# Patient Record
Sex: Female | Born: 1950 | Race: Black or African American | Hispanic: No | Smoking: Never smoker
Health system: Southern US, Community
[De-identification: ages and names within clinical notes are randomized; demographics above are authoritative.]

## PROBLEM LIST (undated history)

## (undated) DIAGNOSIS — I2699 Other pulmonary embolism without acute cor pulmonale: Secondary | ICD-10-CM

## (undated) DIAGNOSIS — J40 Bronchitis, not specified as acute or chronic: Secondary | ICD-10-CM

## (undated) DIAGNOSIS — E119 Type 2 diabetes mellitus without complications: Secondary | ICD-10-CM

## (undated) DIAGNOSIS — I10 Essential (primary) hypertension: Secondary | ICD-10-CM

## (undated) DIAGNOSIS — E7800 Pure hypercholesterolemia, unspecified: Secondary | ICD-10-CM

## (undated) HISTORY — PX: BREAST SURGERY: SHX581

## (undated) HISTORY — PX: TONSILLECTOMY: SUR1361

## (undated) HISTORY — PX: ABDOMINAL HYSTERECTOMY: SHX81

## (undated) HISTORY — PX: BACK SURGERY: SHX140

---

## 2008-02-16 ENCOUNTER — Emergency Department (HOSPITAL_COMMUNITY): Admission: EM | Admit: 2008-02-16 | Discharge: 2008-02-16 | Payer: Self-pay | Admitting: Emergency Medicine

## 2011-05-29 ENCOUNTER — Emergency Department (HOSPITAL_COMMUNITY): Payer: Self-pay

## 2011-05-29 ENCOUNTER — Encounter: Payer: Self-pay | Admitting: *Deleted

## 2011-05-29 ENCOUNTER — Inpatient Hospital Stay (HOSPITAL_COMMUNITY)
Admission: EM | Admit: 2011-05-29 | Discharge: 2011-05-31 | DRG: 192 | Disposition: A | Discharge status: Home / Self Care | Payer: Self-pay | Attending: Internal Medicine | Admitting: Internal Medicine

## 2011-05-29 DIAGNOSIS — J440 Chronic obstructive pulmonary disease with acute lower respiratory infection: Principal | ICD-10-CM | POA: Diagnosis present

## 2011-05-29 DIAGNOSIS — I10 Essential (primary) hypertension: Secondary | ICD-10-CM | POA: Diagnosis present

## 2011-05-29 DIAGNOSIS — J441 Chronic obstructive pulmonary disease with (acute) exacerbation: Secondary | ICD-10-CM | POA: Diagnosis present

## 2011-05-29 DIAGNOSIS — E785 Hyperlipidemia, unspecified: Secondary | ICD-10-CM | POA: Diagnosis present

## 2011-05-29 DIAGNOSIS — J45902 Unspecified asthma with status asthmaticus: Secondary | ICD-10-CM

## 2011-05-29 DIAGNOSIS — J0110 Acute frontal sinusitis, unspecified: Secondary | ICD-10-CM | POA: Diagnosis present

## 2011-05-29 DIAGNOSIS — J209 Acute bronchitis, unspecified: Principal | ICD-10-CM | POA: Diagnosis present

## 2011-05-29 HISTORY — DX: Pure hypercholesterolemia, unspecified: E78.00

## 2011-05-29 HISTORY — DX: Bronchitis, not specified as acute or chronic: J40

## 2011-05-29 HISTORY — DX: Essential (primary) hypertension: I10

## 2011-05-29 MED ORDER — ALBUTEROL SULFATE (5 MG/ML) 0.5% IN NEBU
2.5000 mg | INHALATION_SOLUTION | Freq: Once | RESPIRATORY_TRACT | Status: AC
  Administered 2011-05-29 (×2): 2.5 mg via RESPIRATORY_TRACT
  Filled 2011-05-29: qty 3

## 2011-05-29 MED ORDER — IPRATROPIUM BROMIDE 0.02 % IN SOLN
0.5000 mg | Freq: Once | RESPIRATORY_TRACT | Status: AC
  Administered 2011-05-29: 0.5 mg via RESPIRATORY_TRACT
  Filled 2011-05-29: qty 2.5

## 2011-05-29 MED ORDER — ALBUTEROL SULFATE (2.5 MG/3ML) 0.083% IN NEBU
INHALATION_SOLUTION | RESPIRATORY_TRACT | Status: AC
  Administered 2011-05-29: 2.5 mg via RESPIRATORY_TRACT
  Filled 2011-05-29: qty 3

## 2011-05-29 MED ORDER — ALBUTEROL SULFATE (5 MG/ML) 0.5% IN NEBU
5.0000 mg | INHALATION_SOLUTION | Freq: Once | RESPIRATORY_TRACT | Status: AC
  Administered 2011-05-30: 5 mg via RESPIRATORY_TRACT
  Filled 2011-05-29: qty 6

## 2011-05-29 MED ORDER — PREDNISONE 20 MG PO TABS
60.0000 mg | ORAL_TABLET | Freq: Once | ORAL | Status: AC
  Administered 2011-05-29: 60 mg via ORAL
  Filled 2011-05-29: qty 3

## 2011-05-29 MED ORDER — IPRATROPIUM BROMIDE 0.02 % IN SOLN
0.5000 mg | Freq: Once | RESPIRATORY_TRACT | Status: AC
  Administered 2011-05-30: 0.5 mg via RESPIRATORY_TRACT
  Filled 2011-05-29: qty 2.5

## 2011-05-29 MED ORDER — ALBUTEROL (5 MG/ML) CONTINUOUS INHALATION SOLN
15.0000 mg | INHALATION_SOLUTION | Freq: Once | RESPIRATORY_TRACT | Status: AC
  Administered 2011-05-29: 15 mg via RESPIRATORY_TRACT
  Filled 2011-05-29: qty 20

## 2011-05-29 MED ORDER — SODIUM CHLORIDE 0.9 % IN NEBU
INHALATION_SOLUTION | RESPIRATORY_TRACT | Status: AC
  Administered 2011-05-29: 6 mL
  Filled 2011-05-29: qty 6

## 2011-05-29 MED ORDER — ALBUTEROL SULFATE (2.5 MG/3ML) 0.083% IN NEBU
INHALATION_SOLUTION | RESPIRATORY_TRACT | Status: AC
  Administered 2011-05-29: 20:00:00
  Filled 2011-05-29: qty 18

## 2011-05-29 NOTE — ED Notes (Signed)
Pt states cough and SOB began ~ 2-3 weeks ago.  Hx of asthma and bronchitis. Pt states productive cough, yellow in color. States she has felt "like wind pipe is closing up" for a couple of days. Audible wheezing noted.

## 2011-05-29 NOTE — ED Provider Notes (Cosign Needed)
History     Chief Complaint  Patient presents with  . Shortness of Breath   HPI Comments: Pt with h/o asthma c/o difficulty breathing worsening x 3 months, worse at night. +cough productive of greenish-yellow sputum x 2 months. No fever. Pt uses home neb and inhalers, provides relief for a few hours. +mild nausea and mild sore throat. Runny nose with severe coughing fits only. Chest tightness when she "can't breathe" but no pain. June 28th tests by asthma/allergy specialist, including sleep apnea, will receive results in 3 days, also was seen when sx began. Pt has been on prednisone previously, last time approx 2 months ago. No home O2. Pt is not a smoker. Pt reports she has had asthma for a long time but that it has been worse as an adult.   Patient is a 60 y.o. female presenting with shortness of breath. The history is provided by the patient.  Shortness of Breath  The current episode started more than 2 weeks ago (3 months ago). The onset was gradual. The problem occurs frequently. The problem has been gradually worsening. The problem is moderate. Relieved by: home neb and inhalers. Exacerbated by: lying flat/at night. Associated symptoms include orthopnea, cough, shortness of breath and wheezing. Pertinent negatives include no chest pain and no fever.    Past Medical History  Diagnosis Date  . Hypertension   . Asthma   . Bronchitis   . High cholesterol     Past Surgical History  Procedure Date  . Abdominal hysterectomy   . Breast surgery   . Back surgery   . Tonsillectomy     History reviewed. No pertinent family history.  History  Substance Use Topics  . Smoking status: Never Smoker   . Smokeless tobacco: Not on file  . Alcohol Use: No    OB History    Grav Para Term Preterm Abortions TAB SAB Ect Mult Living                  Review of Systems  Constitutional: Negative for fever.  Respiratory: Positive for cough, shortness of breath and wheezing.   Cardiovascular:  Positive for orthopnea. Negative for chest pain.  All other systems reviewed and are negative.    Physical Exam  BP 156/94  Pulse 98  Temp(Src) 98.1 F (36.7 C) (Oral)  Resp 26  SpO2 99%  Physical Exam  Constitutional: She is oriented to person, place, and time. She appears well-developed and well-nourished.  Non-toxic appearance. She does not appear ill. No distress.  HENT:  Head: Normocephalic and atraumatic.  Right Ear: External ear normal.  Left Ear: External ear normal.  Nose: Nose normal. No mucosal edema or rhinorrhea.  Mouth/Throat: Oropharynx is clear and moist and mucous membranes are normal. No dental abscesses or uvula swelling.  Eyes: Conjunctivae and EOM are normal. Pupils are equal, round, and reactive to light.  Neck: Normal range of motion and full passive range of motion without pain. Neck supple.  Cardiovascular: Normal rate, regular rhythm and normal heart sounds.  Exam reveals no gallop and no friction rub.   No murmur heard. Pulmonary/Chest: Effort normal. No accessory muscle usage. Not tachypneic. No respiratory distress. She has decreased breath sounds. She has wheezes. She has rhonchi. She has no rales. She exhibits no tenderness and no crepitus.  Abdominal: Soft. Normal appearance and bowel sounds are normal. She exhibits no distension. There is no tenderness. There is no rebound and no guarding.  Musculoskeletal: Normal range of  motion. She exhibits no edema and no tenderness.       Moves all extremities well.   Neurological: She is alert and oriented to person, place, and time. She has normal strength. No cranial nerve deficit.  Skin: Skin is warm, dry and intact. No rash noted. No erythema. No pallor.  Psychiatric: She has a normal mood and affect. Her speech is normal and behavior is normal. Her mood appears not anxious.    ED Course  Procedures Written by Enos Fling acting as scribe for Dr. Lynelle Doctor.  I personally performed the services described  in this documentation, which was scribed in my presence. The recorded information has been reviewed and considered. Devoria Albe, MD, FACEP  MDM   PT given continous nebulzer of albuterol/atrovent.   2030 -- Pt reeval: Pt still on neb, wheezing improved but still present, will continue neb and give dose prednisone and recheck again.  2200 -- Pt reeval: Pt breathing improved, mild wheezing, will attempt walking with pulse ox.  When patient ambulated she became very SOB and tachypneic and her wheezing got worse. Pt given another nebulizer.   Results for orders placed during the hospital encounter of 05/29/11  CBC      Component Value Range   WBC 7.3  4.0 - 10.5 (K/uL)   RBC 4.30  3.87 - 5.11 (MIL/uL)   Hemoglobin 13.5  12.0 - 15.0 (g/dL)   HCT 16.1  09.6 - 04.5 (%)   MCV 96.7  78.0 - 100.0 (fL)   MCH 31.4  26.0 - 34.0 (pg)   MCHC 32.5  30.0 - 36.0 (g/dL)   RDW 40.9  81.1 - 91.4 (%)   Platelets 228  150 - 400 (K/uL)  BASIC METABOLIC PANEL      Component Value Range   Sodium 139  135 - 145 (mEq/L)   Potassium 3.6  3.5 - 5.1 (mEq/L)   Chloride 102  96 - 112 (mEq/L)   CO2 27  19 - 32 (mEq/L)   Glucose, Bld 111 (*) 70 - 99 (mg/dL)   BUN 8  6 - 23 (mg/dL)   Creatinine, Ser 7.82  0.50 - 1.10 (mg/dL)   Calcium 9.8  8.4 - 95.6 (mg/dL)   GFR calc non Af Amer >60  >60 (mL/min)   GFR calc Af Amer >60  >60 (mL/min)  Dg Chest 2 View  05/30/2011  *RADIOLOGY REPORT*  Clinical Data: 60 year old female with shortness of breath, cough, wheezing.  CHEST - 2 VIEW  Comparison: None.  Findings: Lung volumes within normal limits.  Cardiac size and mediastinal contours are within normal limits.  Visualized tracheal air column is within normal limits.  Cervical ACDF hardware.  No pneumothorax, pulmonary edema, pleural effusion or confluent pulmonary opacity. No acute osseous abnormality identified.  IMPRESSION: No acute cardiopulmonary abnormality.  Original Report Authenticated By: Harley Hallmark, M.D.    Ward Givens, MD 05/30/11 2130  Ward Givens, MD 06/01/11 1048

## 2011-05-29 NOTE — ED Notes (Signed)
Pt reports feeling better.  Paged respiratory

## 2011-05-29 NOTE — ED Notes (Signed)
Ambulated pt around nurses station.  Pt did not do well.  Pt had to stop in the middle and sit down.  I listened to pt's lungs and there was inspiratory and expiratory wheezing.  Pt also became sob.  Pt back in room and nad noted

## 2011-05-30 ENCOUNTER — Encounter (HOSPITAL_COMMUNITY): Payer: Self-pay | Admitting: *Deleted

## 2011-05-30 DIAGNOSIS — J209 Acute bronchitis, unspecified: Secondary | ICD-10-CM | POA: Diagnosis present

## 2011-05-30 DIAGNOSIS — J441 Chronic obstructive pulmonary disease with (acute) exacerbation: Secondary | ICD-10-CM | POA: Diagnosis present

## 2011-05-30 LAB — CBC
HCT: 41.6 % (ref 36.0–46.0)
HCT: 40.4 % (ref 36.0–46.0)
Hemoglobin: 13.5 g/dL (ref 12.0–15.0)
Hemoglobin: 13.3 g/dL (ref 12.0–15.0)
MCH: 31.4 pg (ref 26.0–34.0)
MCH: 31.8 pg (ref 26.0–34.0)
MCHC: 32.5 g/dL (ref 30.0–36.0)
MCHC: 32.9 g/dL (ref 30.0–36.0)
MCV: 96.7 fL (ref 78.0–100.0)
MCV: 96.7 fL (ref 78.0–100.0)
Platelets: 228 10*3/uL (ref 150–400)
Platelets: 221 10*3/uL (ref 150–400)
RBC: 4.3 MIL/uL (ref 3.87–5.11)
RBC: 4.18 MIL/uL (ref 3.87–5.11)
RDW: 12.7 % (ref 11.5–15.5)
RDW: 12.8 % (ref 11.5–15.5)
WBC: 7.3 10*3/uL (ref 4.0–10.5)
WBC: 4.2 10*3/uL (ref 4.0–10.5)

## 2011-05-30 LAB — BASIC METABOLIC PANEL
BUN: 8 mg/dL (ref 6–23)
CO2: 27 mEq/L (ref 19–32)
Calcium: 9.8 mg/dL (ref 8.4–10.5)
Chloride: 102 mEq/L (ref 96–112)
Creatinine, Ser: 0.61 mg/dL (ref 0.50–1.10)
GFR calc Af Amer: >60 mL/min (ref >60)
GFR calc non Af Amer: >60 mL/min (ref >60)
Glucose, Bld: 111 mg/dL — ABNORMAL HIGH (ref 70–99)
Potassium: 3.6 mEq/L (ref 3.5–5.1)
Sodium: 139 mEq/L (ref 135–145)

## 2011-05-30 LAB — CARDIAC PANEL(CRET KIN+CKTOT+MB+TROPI)
CK, MB: 2.2 ng/mL (ref 0.3–4.0)
Relative Index: 1.5 (ref 0.0–2.5)
Total CK: 142 U/L (ref 7–177)
Troponin I: <0.3 ng/mL (ref <0.30)

## 2011-05-30 LAB — COMPREHENSIVE METABOLIC PANEL
ALT: 34 U/L (ref 0–35)
AST: 18 U/L (ref 0–37)
Albumin: 3.5 g/dL (ref 3.5–5.2)
Alkaline Phosphatase: 101 U/L (ref 39–117)
BUN: 9 mg/dL (ref 6–23)
CO2: 24 mEq/L (ref 19–32)
Calcium: 10.2 mg/dL (ref 8.4–10.5)
Chloride: 103 mEq/L (ref 96–112)
Creatinine, Ser: 0.67 mg/dL (ref 0.50–1.10)
GFR calc Af Amer: >60 mL/min (ref >60)
GFR calc non Af Amer: >60 mL/min (ref >60)
Glucose, Bld: 177 mg/dL — ABNORMAL HIGH (ref 70–99)
Potassium: 4.2 mEq/L (ref 3.5–5.1)
Sodium: 140 mEq/L (ref 135–145)
Total Bilirubin: 0.1 mg/dL — ABNORMAL LOW (ref 0.3–1.2)
Total Protein: 7.5 g/dL (ref 6.0–8.3)

## 2011-05-30 LAB — DIFFERENTIAL
Basophils Absolute: 0 10*3/uL (ref 0.0–0.1)
Basophils Relative: 1 % (ref 0–1)
Eosinophils Absolute: 0 10*3/uL (ref 0.0–0.7)
Eosinophils Relative: 0 % (ref 0–5)
Lymphocytes Relative: 19 % (ref 12–46)
Lymphs Abs: 0.8 10*3/uL (ref 0.7–4.0)
Monocytes Absolute: 0.1 10*3/uL (ref 0.1–1.0)
Monocytes Relative: 2 % — ABNORMAL LOW (ref 3–12)
Neutro Abs: 3.3 10*3/uL (ref 1.7–7.7)
Neutrophils Relative %: 78 % — ABNORMAL HIGH (ref 43–77)

## 2011-05-30 LAB — MAGNESIUM: Magnesium: 2 mg/dL (ref 1.5–2.5)

## 2011-05-30 MED ORDER — ENOXAPARIN SODIUM 40 MG/0.4ML ~~LOC~~ SOLN
40.0000 mg | SUBCUTANEOUS | Status: DC
  Administered 2011-05-30 – 2011-05-31 (×2): 40 mg via SUBCUTANEOUS
  Filled 2011-05-30 (×2): qty 0.4

## 2011-05-30 MED ORDER — SODIUM CHLORIDE 0.9 % IJ SOLN
INTRAMUSCULAR | Status: AC
  Administered 2011-05-30: 05:00:00
  Filled 2011-05-30: qty 10

## 2011-05-30 MED ORDER — TRAZODONE HCL 50 MG PO TABS
150.0000 mg | ORAL_TABLET | Freq: Every day | ORAL | Status: DC
  Administered 2011-05-30: 150 mg via ORAL
  Filled 2011-05-30: qty 3

## 2011-05-30 MED ORDER — ALBUTEROL SULFATE (2.5 MG/3ML) 0.083% IN NEBU
2.5000 mg | INHALATION_SOLUTION | RESPIRATORY_TRACT | Status: DC
  Administered 2011-05-30 (×2): 2.5 mg via RESPIRATORY_TRACT

## 2011-05-30 MED ORDER — LEVOFLOXACIN IN D5W 500 MG/100ML IV SOLN
INTRAVENOUS | Status: AC
  Filled 2011-05-30: qty 100

## 2011-05-30 MED ORDER — LEVOFLOXACIN 500 MG PO TABS
500.0000 mg | ORAL_TABLET | Freq: Every day | ORAL | Status: DC
  Administered 2011-05-30 – 2011-05-31 (×2): 500 mg via ORAL
  Filled 2011-05-30 (×2): qty 1

## 2011-05-30 MED ORDER — LEVOFLOXACIN IN D5W 500 MG/100ML IV SOLN
500.0000 mg | INTRAVENOUS | Status: DC
  Administered 2011-05-30: 500 mg via INTRAVENOUS
  Filled 2011-05-30 (×2): qty 100

## 2011-05-30 MED ORDER — AMLODIPINE BESYLATE 5 MG PO TABS
10.0000 mg | ORAL_TABLET | Freq: Every day | ORAL | Status: DC
  Administered 2011-05-30 – 2011-05-31 (×2): 10 mg via ORAL
  Filled 2011-05-30 (×2): qty 2

## 2011-05-30 MED ORDER — ALBUTEROL SULFATE (2.5 MG/3ML) 0.083% IN NEBU
INHALATION_SOLUTION | RESPIRATORY_TRACT | Status: AC
  Administered 2011-05-30: 2.5 mg via RESPIRATORY_TRACT
  Filled 2011-05-30: qty 3

## 2011-05-30 MED ORDER — PREDNISONE 20 MG PO TABS
40.0000 mg | ORAL_TABLET | Freq: Two times a day (BID) | ORAL | Status: DC
  Administered 2011-05-30 – 2011-05-31 (×3): 40 mg via ORAL
  Filled 2011-05-30 (×3): qty 2

## 2011-05-30 MED ORDER — CALCIUM CARBONATE-VITAMIN D 500-200 MG-UNIT PO TABS
1.0000 | ORAL_TABLET | Freq: Two times a day (BID) | ORAL | Status: DC
  Administered 2011-05-30 – 2011-05-31 (×3): 1 via ORAL
  Filled 2011-05-30 (×3): qty 1

## 2011-05-30 MED ORDER — MONTELUKAST SODIUM 10 MG PO TABS
10.0000 mg | ORAL_TABLET | Freq: Every day | ORAL | Status: DC
  Administered 2011-05-30: 10 mg via ORAL
  Filled 2011-05-30: qty 1

## 2011-05-30 MED ORDER — ACETAMINOPHEN 325 MG PO TABS
650.0000 mg | ORAL_TABLET | Freq: Four times a day (QID) | ORAL | Status: DC | PRN
  Administered 2011-05-30: 650 mg via ORAL
  Filled 2011-05-30: qty 2

## 2011-05-30 MED ORDER — ALBUTEROL SULFATE (2.5 MG/3ML) 0.083% IN NEBU
2.5000 mg | INHALATION_SOLUTION | Freq: Four times a day (QID) | RESPIRATORY_TRACT | Status: DC
  Administered 2011-05-30: 2.5 mg via RESPIRATORY_TRACT

## 2011-05-30 MED ORDER — CALCIUM CARBONATE-VITAMIN D 250-125 MG-UNIT PO TABS
1.0000 | ORAL_TABLET | Freq: Two times a day (BID) | ORAL | Status: DC
  Filled 2011-05-30 (×3): qty 1

## 2011-05-30 MED ORDER — IPRATROPIUM BROMIDE 0.02 % IN SOLN
0.5000 mg | Freq: Four times a day (QID) | RESPIRATORY_TRACT | Status: DC
  Administered 2011-05-30: 0.5 mg via RESPIRATORY_TRACT
  Filled 2011-05-30: qty 2.5

## 2011-05-30 MED ORDER — IPRATROPIUM BROMIDE 0.02 % IN SOLN
0.5000 mg | RESPIRATORY_TRACT | Status: DC
  Administered 2011-05-30 (×2): 0.5 mg via RESPIRATORY_TRACT
  Filled 2011-05-30 (×2): qty 2.5

## 2011-05-30 MED ORDER — METHYLPREDNISOLONE SODIUM SUCC 40 MG IJ SOLR
40.0000 mg | Freq: Four times a day (QID) | INTRAMUSCULAR | Status: DC
  Administered 2011-05-30 (×2): 40 mg via INTRAVENOUS
  Filled 2011-05-30 (×2): qty 1

## 2011-05-30 MED ORDER — SODIUM CHLORIDE 0.9 % IV SOLN: Freq: Once | INTRAVENOUS | Status: DC

## 2011-05-30 MED ORDER — IBUPROFEN 400 MG PO TABS
400.0000 mg | ORAL_TABLET | Freq: Four times a day (QID) | ORAL | Status: DC | PRN
  Administered 2011-05-30 – 2011-05-31 (×2): 400 mg via ORAL
  Filled 2011-05-30 (×2): qty 1

## 2011-05-30 MED ORDER — PANTOPRAZOLE SODIUM 40 MG PO TBEC
40.0000 mg | DELAYED_RELEASE_TABLET | Freq: Every day | ORAL | Status: DC
  Administered 2011-05-30 – 2011-05-31 (×2): 40 mg via ORAL
  Filled 2011-05-30 (×2): qty 1

## 2011-05-30 NOTE — H&P (Signed)
  [id number] 

## 2011-05-30 NOTE — Progress Notes (Signed)
Chart reviewed. Pt examined.  Feels better, but DOE.  Lungs diminished, but good air movement and no W/R/R. See orders

## 2011-05-30 NOTE — ED Notes (Signed)
Patient is resting comfortably. 

## 2011-05-30 NOTE — ED Notes (Signed)
REPORT GIVEN TO NADINE, RN

## 2011-05-30 NOTE — H&P (Signed)
Tanya Fowler, Tanya Fowler             ACCOUNT NO.:  [id number]  MEDICAL RECORD NO.:  [id number]  LOCATION:  [facility]                         FACILITY:  [facility]  PHYSICIAN:  Talmage Nap, MD  DATE OF BIRTH:  Jun 19, 1951  DATE OF ADMISSION:  05/29/2011 DATE OF DISCHARGE:  LH                             HISTORY & PHYSICAL   PRIMARY CARE PHYSICIAN:  Unknown.  History mainly obtainable from the patient.  CHIEF COMPLAINT:  Cough of unspecified duration.  The patient is a 60 year old African American female with history of COPD, questionable asthma, and hypertension presented to the emergency room with cough, which the patient has been on and off for unspecified duration.  However, this present episode was of 2-3 days duration and cough was said to be productive of whitish and sometimes brownish sputum.  This was associated with nonpleuritic chest pain and shortness of breath.  The patient claims she has subjective feeling of fever and chills.  She denied any rigor.  She claimed she was nauseated, but denied any vomiting.  She also denies any history of diarrhea.  No PND or orthopnea.  The cough was said to have been getting progressively worse hence the patient presented to the emergency room to be evaluated.  PAST MEDICAL HISTORY: 1. Positive for hypertension. 2. Questionable COPD. 3. Asthma. 4. Acute bronchitis. 5. Hyperlipidemia.  PAST SURGICAL HISTORY: 1. Abdominal hysterectomy. 2. Lumpectomy. 3. Back surgery. 4. Tonsillectomy.  PREADMISSION MEDICATIONS: 1. Albuterol nebulizer q.6 p.r.n. 2. Budesonide/formoterol (Symbicort) 1 mL/4.5 two puffs b.i.d. 3. Calcium with vitamin D (Os-Cal with D) 250/25 one tablet twice a     day. 4. Omeprazole (Nexium) 40 mg twice a day. 5. Fluticasone (Veramyst) 27.5 mcg 2 sprays daily. 6. Montelukast (singular) 10 mg p.o. daily. 7. Trazodone (Desyrel ) 150 mg p.o. at bedtime.  ALLERGIES:  PENICILLIN.  SOCIAL HISTORY:  Negative for alcohol  or tobacco use.  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  FAMILY HISTORY:  Positive for obstructive airway disease.  REVIEW OF SYSTEMS:  The patient denies any history of headaches.  No blurred vision.  Complained of chronic cough that got progressively worse.  Cough is said to productive of whitish and with occasional brownish sputum with associated nonpleuritic chest pain and shortness of breath.  She also complained of subjective feeling of fever with chills. Denied any rigor.  No PND, orthopnea.  Denied any vomiting but nauseated.  Denies any abdominal discomfort.  No diarrhea or hematochezia.  No dysuria or hematuria.  She has occasional swelling of the lower extremity.  No intolerance to heat or cold, and no neuropsychiatric disorder.  PHYSICAL EXAMINATION:  GENERAL:  Slightly elderly lady not in any obvious respiratory distress with adequate hydration. PRESENT VITAL SIGNS:  Temperature is 98.1, pulse is 107, respiratory rate is 16, blood pressure is 137/75, saturation 94% on room air. HEENT:  Pupils are reactive to light and extraocular muscles are intact. NECK:  No jugular venous distention.  No carotid bruit.  No lymphadenopathy. CHEST:  Wide-spread expiratory rhonchi. HEART:  Sounds are one and two. ABDOMEN:  Soft, nontender.  Liver, spleen, kidney not palpable.  Bowel sounds are positive.  EXTREMITIES:  No pedal edema. NEUROLOGIC:  Nonfocal. MUSCULOSKELETAL:  Unremarkable. SKIN:  Normal turgor.  LABORATORY DATA:  Chemistry showed sodium of 139, potassium of 3.6, chloride of 102 with a bicarb of 27, BUN is 8, creatinine 0.61, glucose is 111.  Complete blood count with differential showed WBC of 7.3, hemoglobin 13.5, hematocrit 44.6, MCV of 96.7 with a platelet count of to 228.  Imaging studies done on the patient include chest x-ray, which showed hyperinflated lung.  No infiltrates seen.  PROBLEM LIST: 1. Chronic cough. 2. Shortness  of breath. 3. Hypertension. 4. History of hyperlipidemia.  IMPRESSION: 1. Chronic obstructive pulmonary disease with acute bronchitis. 2. Chronic cough most likely secondary to ACE inhibitor. 3. Hypertension. 4. Hyperlipidemia. 5. History of asthma.  Plan is to admit the patient to telemetry.  The patient will be on O2 via nasal cannula 2-3 liters per minute.  She will be on albuterol and Atrovent nebs q.4 hourly, and she also will be given IV Solu-Medrol. Other medication to be given to the patient will include Levaquin 500 mg IV q.24.  Blood pressure will be maintained with Norvasc 10 mg p.o. daily, and lisinopril will be discontinued because of history of chronic cough.  The patient will be continued on montelukast 10 mg p.o. daily and trazodone 150 mg p.o. daily.  GI prophylaxis will be with p.o. Protonix, and DVT prophylaxis with TED stockings and Lovenox 40 mg subcu q.24 h.  Further workup to be done on this patient will include cardiac enzymes q.6 x3 and 2D echo to rule out pulmonary hypertension. CBC, CMP, and magnesium will be repeated in the a.m.  The patient will be followed and evaluated on day-to-day basis.     Talmage Nap, MD     CN/MEDQ  D:  05/30/2011  T:  05/30/2011  Job:  [id number]

## 2011-05-30 NOTE — ED Notes (Signed)
Family at bedside. 

## 2011-05-31 DIAGNOSIS — J0110 Acute frontal sinusitis, unspecified: Secondary | ICD-10-CM | POA: Diagnosis present

## 2011-05-31 DIAGNOSIS — I10 Essential (primary) hypertension: Secondary | ICD-10-CM | POA: Diagnosis present

## 2011-05-31 MED ORDER — LEVOFLOXACIN 500 MG PO TABS: 500.0000 mg | ORAL_TABLET | Freq: Every day | ORAL | Status: AC

## 2011-05-31 MED ORDER — IPRATROPIUM BROMIDE 0.02 % IN SOLN
0.5000 mg | Freq: Four times a day (QID) | RESPIRATORY_TRACT | Status: DC
  Administered 2011-05-31: 0.5 mg via RESPIRATORY_TRACT
  Filled 2011-05-31: qty 2.5

## 2011-05-31 MED ORDER — ALBUTEROL SULFATE (2.5 MG/3ML) 0.083% IN NEBU
INHALATION_SOLUTION | RESPIRATORY_TRACT | Status: AC
  Administered 2011-05-31: 2.5 mg via RESPIRATORY_TRACT
  Filled 2011-05-31: qty 3

## 2011-05-31 MED ORDER — ALBUTEROL SULFATE (2.5 MG/3ML) 0.083% IN NEBU
2.5000 mg | INHALATION_SOLUTION | Freq: Four times a day (QID) | RESPIRATORY_TRACT | Status: DC
  Administered 2011-05-31: 2.5 mg via RESPIRATORY_TRACT

## 2011-05-31 MED ORDER — LISINOPRIL 10 MG PO TABS: 10.0000 mg | ORAL_TABLET | Freq: Every day | ORAL | Status: DC

## 2011-05-31 MED ORDER — PREDNISONE (PAK) 10 MG PO TABS: 10.0000 mg | ORAL_TABLET | Freq: Every day | ORAL | Status: AC

## 2011-05-31 NOTE — Progress Notes (Signed)
IV removed from Lt forearm cath tip intact site benign care notes for new meds given. [place] information given Pt discharged to home with friend

## 2011-05-31 NOTE — Discharge Summary (Signed)
Physician Discharge Summary  Patient ID: MELONEY FELD MRN: [id number] DOB/AGE: 1951/11/09 60 y.o.  Admit date: 05/29/2011 Discharge date: 05/31/2011  Discharge Diagnoses:  Principal Problem:  *COPD exacerbation Active Problems:  Acute bronchitis  Acute frontal sinusitis  HTN (hypertension), benign   Discharged Condition: Stable  Hospital Course:   Patient is a 60 year old black female with history of obstructive lung disease who presents with shortness of breath and cough. She was wheezing in the emergency room. She does not smoke but has secondhand smoke exposure. She has had pulmonary function tests previously but does not know the results. She also had frontal sinus pressure and pain. Check postnasal drip. Initially, her cough was productive of purulent sputum. She had unremarkable vital signs on admission. She had rhonchi according to Dr. Wynelle Bourgeois H&P. She was admitted. She was started on steroids bronchodilators oxygen and antibiotics. By the time of discharge, she was feeling much better. She was able to ambulate without difficulty. She had clear lungs on physical examination. She has normal vital signs. Her other medical problems remained stable during hospitalization. Total time on the day of discharge is greater than 30 minutes.  Consults: none  Significant Diagnostic Studies: Dg Chest 2 View  05/30/2011  *RADIOLOGY REPORT*  Clinical Data: 60 year old female with shortness of breath, cough, wheezing.  CHEST - 2 VIEW  Comparison: None.  Findings: Lung volumes within normal limits.  Cardiac size and mediastinal contours are within normal limits.  Visualized tracheal air column is within normal limits.  Cervical ACDF hardware.  No pneumothorax, pulmonary edema, pleural effusion or confluent pulmonary opacity. No acute osseous abnormality identified.  IMPRESSION: No acute cardiopulmonary abnormality.  Original Report Authenticated By: Harley Hallmark, M.D.   Discharge Exam: Blood  pressure 125/75, pulse 105, temperature 98 F (36.7 C), temperature source Oral, resp. rate 18, height 5\' 2"  (1.575 m), weight 90.855 kg (200 lb 4.8 oz), SpO2 95.00%. BP 125/75  Pulse 105  Temp(Src) 98 F (36.7 C) (Oral)  Resp 18  Ht 5\' 2"  (1.575 m)  Wt 90.855 kg (200 lb 4.8 oz)  BMI 36.64 kg/m2  SpO2 95%  General Appearance:    Alert, cooperative, no distress, appears stated age  Head:    Normocephalic, without obvious abnormality, atraumatic. Frontal sinus tenderness  Lungs:     Clear to auscultation bilaterally, respirations unlabored   Heart:    Regular rate and rhythm, S1 and S2 normal, no murmur, rub   or gallop  Abdomen:     Soft, non-tender, bowel sounds active all four quadrants,    no masses, no organomegaly  Extremities:   Extremities normal, atraumatic, no cyanosis or edema    Disposition: Final discharge disposition not confirmed  Discharge Orders    Future Orders Please Complete By Expires   Diet - low sodium heart healthy      Increase activity slowly      Discharge instructions      Comments:   DRINK PLENTY OF LIQUIDS     Current Discharge Medication List    START taking these medications   Details  levofloxacin (LEVAQUIN) 500 MG tablet Take 1 tablet (500 mg total) by mouth daily. Qty: 3 tablet, Refills: 0    predniSONE (DELTASONE) 10 MG tablet pack Take 1 tablet (10 mg total) by mouth daily. 4 tablets daily then decrease by 1 tablet every 2 days until off Qty: 20 tablet, Refills: 0      CONTINUE these medications which have NOT CHANGED  Details  albuterol (PROVENTIL HFA;VENTOLIN HFA) 108 (90 BASE) MCG/ACT inhaler Inhale 2 puffs into the lungs every 6 (six) hours as needed. Shortness of breath     albuterol (PROVENTIL) (2.5 MG/3ML) 0.083% nebulizer solution Take 2.5 mg by nebulization 3 (three) times daily as needed. Shortness of breath     budesonide-formoterol (SYMBICORT) 160-4.5 MCG/ACT inhaler Inhale 2 puffs into the lungs 2 (two) times daily.        calcium-vitamin D (OSCAL WITH D) 250-125 MG-UNIT per tablet Take 1 tablet by mouth 2 (two) times daily.      calcium-vitamin D 250-100 MG-UNIT per tablet Take 1 tablet by mouth daily.      desloratadine (CLARINEX) 5 MG tablet Take 5 mg by mouth every morning.      esomeprazole (NEXIUM) 40 MG packet Take 40 mg by mouth 2 (two) times daily before a meal.      fluticasone (VERAMYST) 27.5 MCG/SPRAY nasal spray Place 1 spray into the nose 2 (two) times daily.      ibuprofen (ADVIL,MOTRIN) 200 MG tablet Take 200 mg by mouth every 6 (six) hours as needed. For headache     ipratropium (ATROVENT) 0.02 % nebulizer solution Take 500 mcg by nebulization 3 (three) times daily as needed. Shortness of breath     lisinopril (PRINIVIL,ZESTRIL) 2.5 MG tablet Take 2.5 mg by mouth daily.      montelukast (SINGULAIR) 10 MG tablet Take 10 mg by mouth at bedtime.      omega-3 acid ethyl esters (LOVAZA) 1 G capsule Take 2 g by mouth 2 (two) times daily.      rosuvastatin (CRESTOR) 5 MG tablet Take 5 mg by mouth once a week.      traZODone (DESYREL) 150 MG tablet Take 150 mg by mouth at bedtime as needed. For sleep        Follow-up Information    Follow up with PRIMARY CARE. (As needed)          Signed: Zaela Graley L 05/31/2011, 11:00 AM

## 2011-07-13 NOTE — Progress Notes (Signed)
Encounter addended by: Clarene Critchley on: 07/13/2011  6:26 AM<BR>     Documentation filed: Flowsheet VN

## 2011-08-08 LAB — URINALYSIS, ROUTINE W REFLEX MICROSCOPIC
Bilirubin Urine: NEGATIVE
Glucose, UA: NEGATIVE
Hgb urine dipstick: NEGATIVE
Ketones, ur: NEGATIVE
Nitrite: NEGATIVE
Protein, ur: NEGATIVE
Specific Gravity, Urine: 1.015
Urobilinogen, UA: 0.2
pH: 7

## 2011-08-08 LAB — URINE MICROSCOPIC-ADD ON

## 2015-08-17 ENCOUNTER — Emergency Department (HOSPITAL_COMMUNITY)
Admission: EM | Admit: 2015-08-17 | Discharge: 2015-08-17 | Disposition: A | Discharge status: Home / Self Care | Payer: Medicare Other | Attending: Emergency Medicine | Admitting: Emergency Medicine

## 2015-08-17 ENCOUNTER — Emergency Department (HOSPITAL_COMMUNITY): Payer: Medicare Other

## 2015-08-17 ENCOUNTER — Encounter (HOSPITAL_COMMUNITY): Payer: Self-pay | Admitting: Emergency Medicine

## 2015-08-17 DIAGNOSIS — Z880 Allergy status to penicillin: Secondary | ICD-10-CM | POA: Diagnosis not present

## 2015-08-17 DIAGNOSIS — Z7901 Long term (current) use of anticoagulants: Secondary | ICD-10-CM | POA: Insufficient documentation

## 2015-08-17 DIAGNOSIS — E7800 Pure hypercholesterolemia, unspecified: Secondary | ICD-10-CM | POA: Diagnosis not present

## 2015-08-17 DIAGNOSIS — Z7951 Long term (current) use of inhaled steroids: Secondary | ICD-10-CM | POA: Insufficient documentation

## 2015-08-17 DIAGNOSIS — R0789 Other chest pain: Secondary | ICD-10-CM | POA: Insufficient documentation

## 2015-08-17 DIAGNOSIS — M79605 Pain in left leg: Secondary | ICD-10-CM | POA: Insufficient documentation

## 2015-08-17 DIAGNOSIS — J45901 Unspecified asthma with (acute) exacerbation: Secondary | ICD-10-CM | POA: Insufficient documentation

## 2015-08-17 DIAGNOSIS — Z79899 Other long term (current) drug therapy: Secondary | ICD-10-CM | POA: Diagnosis not present

## 2015-08-17 DIAGNOSIS — R0781 Pleurodynia: Secondary | ICD-10-CM

## 2015-08-17 DIAGNOSIS — R079 Chest pain, unspecified: Secondary | ICD-10-CM

## 2015-08-17 DIAGNOSIS — I10 Essential (primary) hypertension: Secondary | ICD-10-CM | POA: Insufficient documentation

## 2015-08-17 DIAGNOSIS — M79606 Pain in leg, unspecified: Secondary | ICD-10-CM

## 2015-08-17 DIAGNOSIS — Z86711 Personal history of pulmonary embolism: Secondary | ICD-10-CM | POA: Insufficient documentation

## 2015-08-17 DIAGNOSIS — R0602 Shortness of breath: Secondary | ICD-10-CM

## 2015-08-17 HISTORY — DX: Other pulmonary embolism without acute cor pulmonale: I26.99

## 2015-08-17 HISTORY — DX: Type 2 diabetes mellitus without complications: E11.9

## 2015-08-17 LAB — COMPREHENSIVE METABOLIC PANEL
ALT: 32 U/L (ref 14–54)
AST: 24 U/L (ref 15–41)
Albumin: 3.9 g/dL (ref 3.5–5.0)
Alkaline Phosphatase: 73 U/L (ref 38–126)
Anion gap: 9 (ref 5–15)
BUN: 36 mg/dL — ABNORMAL HIGH (ref 6–20)
CO2: 24 mmol/L (ref 22–32)
Calcium: 9 mg/dL (ref 8.9–10.3)
Chloride: 103 mmol/L (ref 101–111)
Creatinine, Ser: 1.47 mg/dL — ABNORMAL HIGH (ref 0.44–1.00)
GFR calc Af Amer: 42 mL/min — ABNORMAL LOW (ref >60)
GFR calc non Af Amer: 36 mL/min — ABNORMAL LOW (ref >60)
Glucose, Bld: 106 mg/dL — ABNORMAL HIGH (ref 65–99)
Potassium: 4.5 mmol/L (ref 3.5–5.1)
Sodium: 136 mmol/L (ref 135–145)
Total Bilirubin: 0.5 mg/dL (ref 0.3–1.2)
Total Protein: 7.6 g/dL (ref 6.5–8.1)

## 2015-08-17 LAB — CBC WITH DIFFERENTIAL/PLATELET
Basophils Absolute: 0 10*3/uL (ref 0.0–0.1)
Basophils Relative: 1 %
Eosinophils Absolute: 0.5 10*3/uL (ref 0.0–0.7)
Eosinophils Relative: 8 %
HCT: 37 % (ref 36.0–46.0)
Hemoglobin: 12.4 g/dL (ref 12.0–15.0)
Lymphocytes Relative: 31 %
Lymphs Abs: 2.2 10*3/uL (ref 0.7–4.0)
MCH: 32.5 pg (ref 26.0–34.0)
MCHC: 33.5 g/dL (ref 30.0–36.0)
MCV: 96.9 fL (ref 78.0–100.0)
Monocytes Absolute: 0.7 10*3/uL (ref 0.1–1.0)
Monocytes Relative: 10 %
Neutro Abs: 3.6 10*3/uL (ref 1.7–7.7)
Neutrophils Relative %: 50 %
Platelets: 271 10*3/uL (ref 150–400)
RBC: 3.82 MIL/uL — ABNORMAL LOW (ref 3.87–5.11)
RDW: 13.5 % (ref 11.5–15.5)
WBC: 7.1 10*3/uL (ref 4.0–10.5)

## 2015-08-17 LAB — TROPONIN I: Troponin I: <0.03 ng/mL (ref <0.031)

## 2015-08-17 MED ORDER — HYDROCODONE-ACETAMINOPHEN 5-325 MG PO TABS
2.0000 | ORAL_TABLET | Freq: Once | ORAL | Status: AC
  Administered 2015-08-17: 2 via ORAL
  Filled 2015-08-17: qty 2

## 2015-08-17 MED ORDER — FENTANYL CITRATE (PF) 100 MCG/2ML IJ SOLN
50.0000 ug | Freq: Once | INTRAMUSCULAR | Status: AC
  Administered 2015-08-17: 50 ug via INTRAVENOUS
  Filled 2015-08-17: qty 2

## 2015-08-17 MED ORDER — HYDROCODONE-ACETAMINOPHEN 5-325 MG PO TABS: 2.0000 | ORAL_TABLET | ORAL | Status: DC | PRN

## 2015-08-17 MED ORDER — AZITHROMYCIN 250 MG PO TABS
ORAL_TABLET | ORAL | Status: DC
Start: 2015-08-17 — End: 2020-12-18

## 2015-08-17 MED ORDER — IOHEXOL 350 MG/ML SOLN
80.0000 mL | Freq: Once | INTRAVENOUS | Status: AC | PRN
  Administered 2015-08-17: 80 mL via INTRAVENOUS

## 2015-08-17 NOTE — Discharge Instructions (Signed)
Please see your doctor later this week for reassessment. Continue to take your blood thinners until your physician directs you to stop. You'll need a repeat CT scan of your lungs in 4-6 weeks, your primary doctor can order.  If you were given medicines take as directed.  If you are on coumadin or contraceptives realize their levels and effectiveness is altered by many different medicines.  If you have any reaction (rash, tongues swelling, other) to the medicines stop taking and see a physician.    If your blood pressure was elevated in the ER make sure you follow up for management with a primary doctor or return for chest pain, shortness of breath or stroke symptoms.  Please follow up as directed and return to the ER or see a physician for new or worsening symptoms.  Thank you. Filed Vitals:   08/17/15 0619 08/17/15 0630 08/17/15 0700  BP: 106/69 91/59 104/59  Pulse: 87 83 78  Temp: 97.9 F (36.6 C)    TempSrc: Oral    Resp: Height:  (1.575 m)    Weight: 225 lb (102.059 kg)    SpO2: 99% 100% 98%

## 2015-08-17 NOTE — ED Provider Notes (Addendum)
Patient's care signed out to me to follow-up CT pulmonary embolism study for final disposition. Instructions if negative for outpatient follow-up and if positive for blood clot to discuss with hematology for blood thinners. CT scan results reviewed no blood clot however patient has small consolidation area that needs repeat CT scan in 4-6 weeks to assess for possible cancer. Discharge discussed and repeat CT scan discuss.\ Ultrasound reviewed no blood clots. Patient's pain was worsening in the right lung area prior to discharge, repeat pain meds ordered. Discussed trial of antibody for possible pneumonia however she understands this may be tumor/malignancy and will follow-up closely. Patient has had a cough recently. Labs Reviewed  CBC WITH DIFFERENTIAL/PLATELET - Abnormal; Notable for the following:    RBC 3.82 (*)    All other components within normal limits  COMPREHENSIVE METABOLIC PANEL - Abnormal; Notable for the following:    Glucose, Bld 106 (*)    BUN 36 (*)    Creatinine, Ser 1.47 (*)    GFR calc non Af Amer 36 (*)    GFR calc Af Amer 42 (*)    All other components within normal limits  TROPONIN I     Dg Chest 2 View  08/17/2015   CLINICAL DATA:  Chest pain and shortness of breath beginning this morning.  EXAM: CHEST  2 VIEW  COMPARISON:  05/29/2011  FINDINGS: Prior right thoracotomy noted with mild scarring seen in the lateral right lung base. No evidence of pulmonary infiltrate or edema. No evidence of pleural effusion. Heart size and mediastinal contours are within normal limits. Cervical spine fusion hardware noted.  IMPRESSION: Postsurgical changes in right hemithorax. No active cardiopulmonary disease.   Electronically Signed   By: Myles Rosenthal M.D.   On: 08/17/2015 07:11   Ct Angio Chest Pe W/cm &/or Wo Cm  08/17/2015   CLINICAL DATA:  Left-sided and central chest pain. Reported history of prior pulmonary embolus.  EXAM: CT ANGIOGRAPHY CHEST WITH CONTRAST  TECHNIQUE:  Multidetector CT imaging of the chest was performed using the standard protocol during bolus administration of intravenous contrast. Multiplanar CT image reconstructions and MIPs were obtained to evaluate the vascular anatomy.  CONTRAST:  80mL OMNIPAQUE IOHEXOL 350 MG/ML SOLN  COMPARISON:  Chest radiograph August 17, 2015  FINDINGS: There is no demonstrable pulmonary embolus. There is no thoracic aortic aneurysm or dissection. The visualize great vessels appear unremarkable.  There is scarring with atelectatic change in the right base region. There is mild posterior pleural thickening bilaterally with fatty change, more on the right than on the left. There is an area of opacity along the pleura of the periphery of the posterior aspect of the right middle lobe region which shows soft tissue attenuation measuring 2.5 x 0.8 cm, best seen on axial slice 42 series 8. There is mild atelectasis in the left base.  Visualized thyroid appears normal.  There is an enlarged lymph node in the right hilum measuring 2.0 x 1.5 cm. There is a mildly prominent lymph node in the anterior mediastinum measuring 1.5 x 1.0 cm seen on axial slice 24 series 7. There are multiple smaller lymph nodes in the mediastinum which do not meet size criteria for pathologic significance. The pericardium is not thickened.  Visualized upper abdominal structures appear normal.  There are no blastic or lytic bone lesions. There is postoperative change in the visualize lower cervical spine.  Review of the MIP images confirms the above findings.  IMPRESSION: No demonstrable pulmonary embolus.  Area  of apparent consolidation along the posterior aspect of the right middle lobe laterally. This area warrants a followup study in 4-6 weeks to assess for clearing. A mass abutting the pleura in this area is possible. There is atelectatic change bilaterally as well.  Focal enlarged lymph nodes in the right hilum and anterior mediastinum of uncertain etiology.  Particular attention to these lymph nodes on subsequent evaluations warranted.   Electronically Signed   By: Bretta Bang III M.D.   On: 08/17/2015 08:56   US Venous Img Lower Bilateral  08/17/2015   CLINICAL DATA:  BILATERAL lower extremity pain. Prior history of DVT. History of anticoagulation therapy.  EXAM: BILATERAL LOWER EXTREMITY VENOUS DOPPLER ULTRASOUND  TECHNIQUE: Gray-scale sonography with graded compression, as well as color Doppler and duplex ultrasound were performed to evaluate the lower extremity deep venous systems from the level of the common femoral vein and including the common femoral, femoral, profunda femoral, popliteal and calf veins including the posterior tibial, peroneal and gastrocnemius veins when visible. The superficial great saphenous vein was also interrogated. Spectral Doppler was utilized to evaluate flow at rest and with distal augmentation maneuvers in the common femoral, femoral and popliteal veins.  COMPARISON:  None.  FINDINGS: RIGHT LOWER EXTREMITY  Common Femoral Vein: No evidence of thrombus. Normal compressibility, respiratory phasicity and response to augmentation.  Saphenofemoral Junction: No evidence of thrombus. Normal compressibility and flow on color Doppler imaging.  Profunda Femoral Vein: No evidence of thrombus. Normal compressibility and flow on color Doppler imaging.  Femoral Vein: No evidence of thrombus. Normal compressibility, respiratory phasicity and response to augmentation.  Popliteal Vein: No evidence of thrombus. Normal compressibility, respiratory phasicity and response to augmentation.  Calf Veins: No evidence of thrombus. Normal compressibility and flow on color Doppler imaging.  Superficial Great Saphenous Vein: No evidence of thrombus. Normal compressibility and flow on color Doppler imaging.  Venous Reflux:  None.  Other Findings:  None.  LEFT LOWER EXTREMITY  Common Femoral Vein: No evidence of thrombus. Normal compressibility,  respiratory phasicity and response to augmentation.  Saphenofemoral Junction: No evidence of thrombus. Normal compressibility and flow on color Doppler imaging.  Profunda Femoral Vein: No evidence of thrombus. Normal compressibility and flow on color Doppler imaging.  Femoral Vein: No evidence of thrombus. Normal compressibility, respiratory phasicity and response to augmentation.  Popliteal Vein: No evidence of thrombus. Normal compressibility, respiratory phasicity and response to augmentation.  Calf Veins: No evidence of thrombus. Normal compressibility and flow on color Doppler imaging.  Superficial Great Saphenous Vein: No evidence of thrombus. Normal compressibility and flow on color Doppler imaging.  Venous Reflux:  None.  Other Findings:  None.  IMPRESSION: No evidence of RIGHT or LEFT-sided deep venous thrombosis.   Electronically Signed   By: Elsie Stain M.D.   On: 08/17/2015 08:27     Blane Ohara, MD 08/17/15 1610  Blane Ohara, MD 08/17/15 [phone]

## 2015-08-17 NOTE — ED Notes (Signed)
Pt seen in [place] 9/28 and dx w/ PE. Pt says been hurts since then, worse tonight.

## 2015-08-17 NOTE — ED Notes (Signed)
Pt c/o central chest pain that radiates down left arm. Pt was seen at [place] for the same.

## 2015-08-17 NOTE — ED Provider Notes (Signed)
CSN: [id number]     Arrival date & time 08/17/15  0605 History   First MD Initiated Contact with Patient 08/17/15 [phone]    Chief Complaint  Patient presents with  . Chest Pain     (Consider location/radiation/quality/duration/timing/severity/associated sxs/prior Treatment) HPI patient reports she was diagnosed with PE/DVT around 2012 and was on ?Arixtra until July when she was diagnosed with a pulmonary embolus at [facility] ED. At that time her medication was switched to Eliquis which she has been taking since July. She states she was seen again at [facility] ED on September 28 and was diagnosed with a PE. Her medications were not changed except she was started on hydrocodone for pain. She states she basically is still having left-sided chest pain that is constant but pleuritic. She states at times it's achy, tingling, and dull. She's been having some intermittent shortness of breath and wheezing that is helped when she uses her nebulizer. She states she has had a cough with green sputum however it is also better with her using the nebulizer. She states today she had chills but she's unsure of fever. She complains of pain in her knees, thighs, and ankles that of been coming and going since July. She states she has not been able to sleep all night due to the pain. She states she was seen by her cardiologist while she was in the ED and she has an appointment to see him in a couple weeks and they're going to discuss some type of surgery to her legs.  PCP Dr Rivka Spring in [facility] Dr Earna Coder in [place]  Past Medical History  Diagnosis Date  . Hypertension   . Asthma   . Bronchitis   . High cholesterol   . PE (pulmonary embolism)    Past Surgical History  Procedure Laterality Date  . Abdominal hysterectomy    . Breast surgery    . Back surgery    . Tonsillectomy     History reviewed. No pertinent family history. Social History  Substance Use Topics  . Smoking status: Never  Smoker   . Smokeless tobacco: None  . Alcohol Use: No   retired  OB History    No data available     Review of Systems  All other systems reviewed and are negative.     Allergies  Penicillins  Home Medications   Prior to Admission medications   Medication Sig Start Date End Date Taking? Authorizing Provider  albuterol (PROVENTIL HFA;VENTOLIN HFA) 108 (90 BASE) MCG/ACT inhaler Inhale 2 puffs into the lungs every 6 (six) hours as needed. Shortness of breath    Yes Historical Provider, MD  albuterol (PROVENTIL) (2.5 MG/3ML) 0.083% nebulizer solution Take 2.5 mg by nebulization 3 (three) times daily as needed. Shortness of breath    Yes Historical Provider, MD  amLODipine-olmesartan (AZOR) 5-40 MG tablet Take 1 tablet by mouth daily.   Yes Historical Provider, MD  apixaban (ELIQUIS) 5 MG TABS tablet Take 5 mg by mouth 2 (two) times daily.   Yes Historical Provider, MD  calcium-vitamin D (OSCAL WITH D) 250-125 MG-UNIT per tablet Take 1 tablet by mouth 2 (two) times daily.     Yes Historical Provider, MD  calcium-vitamin D 250-100 MG-UNIT per tablet Take 1 tablet by mouth daily.     Yes Historical Provider, MD  cetirizine (ZYRTEC) 10 MG tablet Take 10 mg by mouth daily.   Yes Historical Provider, MD  esomeprazole (NEXIUM) 40 MG packet Take 40 mg by  mouth 2 (two) times daily before a meal.     Yes Historical Provider, MD  fluticasone (VERAMYST) 27.5 MCG/SPRAY nasal spray Place 1 spray into the nose 2 (two) times daily.     Yes Historical Provider, MD  Fluticasone-Salmeterol (ADVAIR) 500-50 MCG/DOSE AEPB Inhale 1 puff into the lungs 2 (two) times daily.   Yes Historical Provider, MD  furosemide (LASIX) 20 MG tablet Take 20 mg by mouth.   Yes Historical Provider, MD  ibuprofen (ADVIL,MOTRIN) 200 MG tablet Take 200 mg by mouth every 6 (six) hours as needed. For headache    Yes Historical Provider, MD  ipratropium (ATROVENT) 0.02 % nebulizer solution Take 500 mcg by nebulization 3 (three)  times daily as needed. Shortness of breath    Yes Historical Provider, MD  metFORMIN (GLUCOPHAGE) 500 MG tablet Take by mouth 2 (two) times daily with a meal.   Yes Historical Provider, MD  montelukast (SINGULAIR) 10 MG tablet Take 10 mg by mouth at bedtime.     Yes Historical Provider, MD  omega-3 acid ethyl esters (LOVAZA) 1 G capsule Take 2 g by mouth 2 (two) times daily.     Yes Historical Provider, MD  budesonide-formoterol (SYMBICORT) 160-4.5 MCG/ACT inhaler Inhale 2 puffs into the lungs 2 (two) times daily.      Historical Provider, MD  desloratadine (CLARINEX) 5 MG tablet Take 5 mg by mouth every morning.      Historical Provider, MD  rosuvastatin (CRESTOR) 5 MG tablet Take 5 mg by mouth once a week.      Historical Provider, MD  traZODone (DESYREL) 150 MG tablet Take 150 mg by mouth at bedtime as needed. For sleep     Historical Provider, MD   BP 106/69 mmHg  Pulse 87  Temp(Src) 97.9 F (36.6 C) (Oral)  Resp 18  Ht 5\' 2"  (1.575 m)  Wt 225 lb (102.059 kg)  BMI 41.14 kg/m2  SpO2 99%  Vital signs normal (pulse ox was 100% on room air during my exam)  Physical Exam  Constitutional: She is oriented to person, place, and time. She appears well-developed and well-nourished.  Non-toxic appearance. She does not appear ill. No distress.  HENT:  Head: Normocephalic and atraumatic.  Right Ear: External ear normal.  Left Ear: External ear normal.  Nose: Nose normal. No mucosal edema or rhinorrhea.  Mouth/Throat: Oropharynx is clear and moist and mucous membranes are normal. No dental abscesses or uvula swelling.  Eyes: Conjunctivae and EOM are normal. Pupils are equal, round, and reactive to light.  Neck: Normal range of motion and full passive range of motion without pain. Neck supple.  Cardiovascular: Normal rate, regular rhythm and normal heart sounds.  Exam reveals no gallop and no friction rub.   No murmur heard. Pulmonary/Chest: Effort normal. No respiratory distress. She has  decreased breath sounds. She has no wheezes. She has no rhonchi. She has no rales. She exhibits no tenderness and no crepitus.  When patient breathes deep she acts as if it's painful  Abdominal: Soft. Normal appearance and bowel sounds are normal. She exhibits no distension. There is no tenderness. There is no rebound and no guarding.  Musculoskeletal: Normal range of motion. She exhibits no edema or tenderness.  Moves all extremities well. Patient's left leg appears to be slightly larger than her right. She states that is chronic. She has tenderness to palpation in her distal medial left thigh. She has right calf tenderness without cords felt.  Neurological: She is alert and  oriented to person, place, and time. She has normal strength. No cranial nerve deficit.  Skin: Skin is warm, dry and intact. No rash noted. No erythema. No pallor.  Psychiatric: She has a normal mood and affect. Her speech is normal and behavior is normal. Her mood appears not anxious.  Nursing note and vitals reviewed.   ED Course  Procedures (including critical care time)  Medications  fentaNYL (SUBLIMAZE) injection 50 mcg (50 mcg Intravenous Given 08/17/15 0711)   Patient was given IV fentanyl for her pain. She had a CT and you have done of her chest for her persistent and worsening left-sided chest pain and shortness of breath. She also had Doppler ultrasounds ordered of her lower extremities which have continued to hurt since July. Of concern is that she was already on Eliquis when she was diagnosed with a PE last week. Her medications were not changed. Although she has paperwork from [facility] ED the discharge diagnosis did say pulmonary embolus but the CT report is not on the discharge papers. If she indeed does have new pulmonary emboli patient should be discussed with hematology to discuss her anticoagulation.  07:40 pt left at change of shift with Dr Jodi Mourning to get results of her CT angio chest and her doppler US of  bilateral legs.   Labs Review Results for orders placed or performed during the hospital encounter of 08/17/15  CBC with Differential  Result Value Ref Range   WBC 7.1 4.0 - 10.5 K/uL   RBC 3.82 (L) 3.87 - 5.11 MIL/uL   Hemoglobin 12.4 12.0 - 15.0 g/dL   HCT 62.9 52.8 - 41.3 %   MCV 96.9 78.0 - 100.0 fL   MCH 32.5 26.0 - 34.0 pg   MCHC 33.5 30.0 - 36.0 g/dL   RDW 24.4 01.0 - 27.2 %   Platelets 271 150 - 400 K/uL   Neutrophils Relative % 50 %   Neutro Abs 3.6 1.7 - 7.7 K/uL   Lymphocytes Relative 31 %   Lymphs Abs 2.2 0.7 - 4.0 K/uL   Monocytes Relative 10 %   Monocytes Absolute 0.7 0.1 - 1.0 K/uL   Eosinophils Relative 8 %   Eosinophils Absolute 0.5 0.0 - 0.7 K/uL   Basophils Relative 1 %   Basophils Absolute 0.0 0.0 - 0.1 K/uL  Comprehensive metabolic panel  Result Value Ref Range   Sodium 136 135 - 145 mmol/L   Potassium 4.5 3.5 - 5.1 mmol/L   Chloride 103 101 - 111 mmol/L   CO2 24 22 - 32 mmol/L   Glucose, Bld 106 (H) 65 - 99 mg/dL   BUN 36 (H) 6 - 20 mg/dL   Creatinine, Ser 5.36 (H) 0.44 - 1.00 mg/dL   Calcium 9.0 8.9 - 64.4 mg/dL   Total Protein 7.6 6.5 - 8.1 g/dL   Albumin 3.9 3.5 - 5.0 g/dL   AST 24 15 - 41 U/L   ALT 32 14 - 54 U/L   Alkaline Phosphatase 73 38 - 126 U/L   Total Bilirubin 0.5 0.3 - 1.2 mg/dL   GFR calc non Af Amer 36 (L) >60 mL/min   GFR calc Af Amer 42 (L) >60 mL/min   Anion gap 9 5 - 15  Troponin I  Result Value Ref Range   Troponin I <0.03 <0.031 ng/mL   Laboratory interpretation all normal except renal insufficiency     Imaging Review Dg Chest 2 View  08/17/2015   CLINICAL DATA:  Chest pain and shortness  of breath beginning this morning.  EXAM: CHEST  2 VIEW  COMPARISON:  05/29/2011  FINDINGS: Prior right thoracotomy noted with mild scarring seen in the lateral right lung base. No evidence of pulmonary infiltrate or edema. No evidence of pleural effusion. Heart size and mediastinal contours are within normal limits. Cervical spine  fusion hardware noted.  IMPRESSION: Postsurgical changes in right hemithorax. No active cardiopulmonary disease.   Electronically Signed   By: Myles Rosenthal M.D.   On: 08/17/2015 07:11   I have personally reviewed and evaluated these images and lab results as part of my medical decision-making.   EKG Interpretation   Date/Time:  Tuesday August 17 2015 06:17:34 EDT Ventricular Rate:  87 PR Interval:  162 QRS Duration: 70 QT Interval:  359 QTC Calculation: 432 R Axis:   35 Text Interpretation:  Sinus rhythm Low voltage, precordial leads Baseline  wander in lead(s) V6 Otherwise within normal limits No old tracing to  compare Confirmed by Eudelia Hiltunen  MD-I, Lucia Mccreadie ([id number]) on 08/17/2015 7:35:49 AM      MDM   Final diagnoses:  Left sided chest pain  Pleuritic chest pain  Shortness of breath  Pain of lower extremity, unspecified laterality    Disposition pending  Devoria Albe, MD, Concha Pyo, MD 08/17/15 [phone]

## 2018-08-06 ENCOUNTER — Institutional Professional Consult (permissible substitution): Payer: Medicare Other | Admitting: Internal Medicine

## 2020-12-17 ENCOUNTER — Inpatient Hospital Stay (HOSPITAL_COMMUNITY)
Admission: EM | Admit: 2020-12-17 | Discharge: 2020-12-19 | DRG: 191 | Disposition: A | Discharge status: Home / Self Care | Payer: Medicare PPO | Attending: Family Medicine | Admitting: Family Medicine

## 2020-12-17 ENCOUNTER — Other Ambulatory Visit: Payer: Self-pay

## 2020-12-17 ENCOUNTER — Emergency Department (HOSPITAL_COMMUNITY): Payer: Medicare PPO

## 2020-12-17 ENCOUNTER — Encounter (HOSPITAL_COMMUNITY): Payer: Self-pay

## 2020-12-17 DIAGNOSIS — I10 Essential (primary) hypertension: Secondary | ICD-10-CM | POA: Diagnosis present

## 2020-12-17 DIAGNOSIS — G4733 Obstructive sleep apnea (adult) (pediatric): Secondary | ICD-10-CM | POA: Diagnosis present

## 2020-12-17 DIAGNOSIS — E7800 Pure hypercholesterolemia, unspecified: Secondary | ICD-10-CM | POA: Diagnosis present

## 2020-12-17 DIAGNOSIS — Z8616 Personal history of COVID-19: Secondary | ICD-10-CM | POA: Diagnosis not present

## 2020-12-17 DIAGNOSIS — J4551 Severe persistent asthma with (acute) exacerbation: Secondary | ICD-10-CM | POA: Diagnosis not present

## 2020-12-17 DIAGNOSIS — Z881 Allergy status to other antibiotic agents status: Secondary | ICD-10-CM | POA: Diagnosis not present

## 2020-12-17 DIAGNOSIS — Z888 Allergy status to other drugs, medicaments and biological substances status: Secondary | ICD-10-CM

## 2020-12-17 DIAGNOSIS — J441 Chronic obstructive pulmonary disease with (acute) exacerbation: Secondary | ICD-10-CM | POA: Diagnosis present

## 2020-12-17 DIAGNOSIS — Z6841 Body Mass Index (BMI) 40.0 and over, adult: Secondary | ICD-10-CM | POA: Diagnosis not present

## 2020-12-17 DIAGNOSIS — E785 Hyperlipidemia, unspecified: Secondary | ICD-10-CM | POA: Diagnosis present

## 2020-12-17 DIAGNOSIS — Z7952 Long term (current) use of systemic steroids: Secondary | ICD-10-CM

## 2020-12-17 DIAGNOSIS — Z79899 Other long term (current) drug therapy: Secondary | ICD-10-CM

## 2020-12-17 DIAGNOSIS — Z86711 Personal history of pulmonary embolism: Secondary | ICD-10-CM

## 2020-12-17 DIAGNOSIS — Z20822 Contact with and (suspected) exposure to covid-19: Secondary | ICD-10-CM | POA: Diagnosis present

## 2020-12-17 DIAGNOSIS — E119 Type 2 diabetes mellitus without complications: Secondary | ICD-10-CM

## 2020-12-17 DIAGNOSIS — Z7901 Long term (current) use of anticoagulants: Secondary | ICD-10-CM | POA: Diagnosis not present

## 2020-12-17 DIAGNOSIS — Z86718 Personal history of other venous thrombosis and embolism: Secondary | ICD-10-CM

## 2020-12-17 DIAGNOSIS — I119 Hypertensive heart disease without heart failure: Secondary | ICD-10-CM | POA: Diagnosis present

## 2020-12-17 DIAGNOSIS — Z7984 Long term (current) use of oral hypoglycemic drugs: Secondary | ICD-10-CM

## 2020-12-17 DIAGNOSIS — J45901 Unspecified asthma with (acute) exacerbation: Secondary | ICD-10-CM | POA: Diagnosis present

## 2020-12-17 DIAGNOSIS — Z880 Allergy status to penicillin: Secondary | ICD-10-CM

## 2020-12-17 LAB — POC SARS CORONAVIRUS 2 AG -  ED: SARS Coronavirus 2 Ag: NEGATIVE

## 2020-12-17 LAB — CBC WITH DIFFERENTIAL/PLATELET
Abs Immature Granulocytes: 0.02 10*3/uL (ref 0.00–0.07)
Basophils Absolute: 0.1 10*3/uL (ref 0.0–0.1)
Basophils Relative: 1 %
Eosinophils Absolute: 0.7 10*3/uL — ABNORMAL HIGH (ref 0.0–0.5)
Eosinophils Relative: 12 %
HCT: 42.6 % (ref 36.0–46.0)
Hemoglobin: 13.7 g/dL (ref 12.0–15.0)
Immature Granulocytes: 0 %
Lymphocytes Relative: 33 %
Lymphs Abs: 2 10*3/uL (ref 0.7–4.0)
MCH: 31.1 pg (ref 26.0–34.0)
MCHC: 32.2 g/dL (ref 30.0–36.0)
MCV: 96.8 fL (ref 80.0–100.0)
Monocytes Absolute: 0.4 10*3/uL (ref 0.1–1.0)
Monocytes Relative: 7 %
Neutro Abs: 2.8 10*3/uL (ref 1.7–7.7)
Neutrophils Relative %: 47 %
Platelets: 233 10*3/uL (ref 150–400)
RBC: 4.4 MIL/uL (ref 3.87–5.11)
RDW: 14.9 % (ref 11.5–15.5)
WBC: 6 10*3/uL (ref 4.0–10.5)
nRBC: 0 % (ref 0.0–0.2)

## 2020-12-17 LAB — BLOOD GAS, VENOUS
Acid-Base Excess: 0.5 mmol/L (ref 0.0–2.0)
Bicarbonate: 23.7 mmol/L (ref 20.0–28.0)
FIO2: 21
O2 Saturation: 60.5 %
Patient temperature: 37
pCO2, Ven: 44 mmHg (ref 44.0–60.0)
pH, Ven: 7.374 (ref 7.250–7.430)
pO2, Ven: 34.2 mmHg (ref 32.0–45.0)

## 2020-12-17 LAB — BASIC METABOLIC PANEL
Anion gap: 8 (ref 5–15)
BUN: 9 mg/dL (ref 8–23)
CO2: 24 mmol/L (ref 22–32)
Calcium: 9.3 mg/dL (ref 8.9–10.3)
Chloride: 107 mmol/L (ref 98–111)
Creatinine, Ser: 0.67 mg/dL (ref 0.44–1.00)
GFR, Estimated: >60 mL/min (ref >60)
Glucose, Bld: 134 mg/dL — ABNORMAL HIGH (ref 70–99)
Potassium: 3.8 mmol/L (ref 3.5–5.1)
Sodium: 139 mmol/L (ref 135–145)

## 2020-12-17 LAB — CBG MONITORING, ED
Glucose-Capillary: 277 mg/dL — ABNORMAL HIGH (ref 70–99)
Glucose-Capillary: 222 mg/dL — ABNORMAL HIGH (ref 70–99)

## 2020-12-17 LAB — HEMOGLOBIN A1C
Hgb A1c MFr Bld: 6.1 % — ABNORMAL HIGH (ref 4.8–5.6)
Mean Plasma Glucose: 128.37 mg/dL

## 2020-12-17 LAB — BRAIN NATRIURETIC PEPTIDE: B Natriuretic Peptide: 34 pg/mL (ref 0.0–100.0)

## 2020-12-17 LAB — TROPONIN I (HIGH SENSITIVITY): Troponin I (High Sensitivity): <2 ng/L (ref <18)

## 2020-12-17 LAB — SARS CORONAVIRUS 2 BY RT PCR (HOSPITAL ORDER, PERFORMED IN ~~LOC~~ HOSPITAL LAB): SARS Coronavirus 2: NEGATIVE

## 2020-12-17 MED ORDER — LEVALBUTEROL HCL 0.63 MG/3ML IN NEBU
0.6300 mg | INHALATION_SOLUTION | Freq: Three times a day (TID) | RESPIRATORY_TRACT | Status: DC
  Administered 2020-12-18 – 2020-12-19 (×4): 0.63 mg via RESPIRATORY_TRACT
  Filled 2020-12-17 (×4): qty 3

## 2020-12-17 MED ORDER — ROSUVASTATIN CALCIUM 10 MG PO TABS
5.0000 mg | ORAL_TABLET | ORAL | Status: DC
  Administered 2020-12-17 (×2): 5 mg via ORAL
  Filled 2020-12-17: qty 1

## 2020-12-17 MED ORDER — IPRATROPIUM-ALBUTEROL 0.5-2.5 (3) MG/3ML IN SOLN: 3.0000 mL | RESPIRATORY_TRACT | Status: DC | PRN

## 2020-12-17 MED ORDER — IOHEXOL 350 MG/ML SOLN
75.0000 mL | Freq: Once | INTRAVENOUS | Status: AC | PRN
  Administered 2020-12-17: 75 mL via INTRAVENOUS

## 2020-12-17 MED ORDER — TRAZODONE HCL 50 MG PO TABS: 100.0000 mg | ORAL_TABLET | Freq: Every evening | ORAL | Status: DC | PRN

## 2020-12-17 MED ORDER — BUDESONIDE 0.5 MG/2ML IN SUSP
0.5000 mg | Freq: Two times a day (BID) | RESPIRATORY_TRACT | Status: DC
  Administered 2020-12-17 – 2020-12-19 (×4): 0.5 mg via RESPIRATORY_TRACT
  Filled 2020-12-17 (×4): qty 2

## 2020-12-17 MED ORDER — IRBESARTAN 150 MG PO TABS
300.0000 mg | ORAL_TABLET | Freq: Every day | ORAL | Status: DC
  Administered 2020-12-17: 300 mg via ORAL
  Filled 2020-12-17 (×2): qty 2

## 2020-12-17 MED ORDER — METHYLPREDNISOLONE SODIUM SUCC 125 MG IJ SOLR
125.0000 mg | Freq: Once | INTRAMUSCULAR | Status: AC
  Administered 2020-12-17: 125 mg via INTRAVENOUS
  Filled 2020-12-17: qty 2

## 2020-12-17 MED ORDER — CALCIUM CITRATE-VITAMIN D 250-100 MG-UNIT PO TABS: 1.0000 | ORAL_TABLET | Freq: Every day | ORAL | Status: DC

## 2020-12-17 MED ORDER — LEVALBUTEROL HCL 0.63 MG/3ML IN NEBU
0.6300 mg | INHALATION_SOLUTION | Freq: Four times a day (QID) | RESPIRATORY_TRACT | Status: DC
  Administered 2020-12-17 (×2): 0.63 mg via RESPIRATORY_TRACT
  Filled 2020-12-17 (×2): qty 3

## 2020-12-17 MED ORDER — ALBUTEROL (5 MG/ML) CONTINUOUS INHALATION SOLN
10.0000 mg/h | INHALATION_SOLUTION | Freq: Once | RESPIRATORY_TRACT | Status: AC
  Administered 2020-12-17: 10 mg/h via RESPIRATORY_TRACT
  Filled 2020-12-17: qty 20

## 2020-12-17 MED ORDER — MONTELUKAST SODIUM 10 MG PO TABS
10.0000 mg | ORAL_TABLET | Freq: Every day | ORAL | Status: DC
  Administered 2020-12-17 – 2020-12-18 (×2): 10 mg via ORAL
  Filled 2020-12-17 (×2): qty 1

## 2020-12-17 MED ORDER — MAGNESIUM SULFATE 2 GM/50ML IV SOLN
2.0000 g | Freq: Once | INTRAVENOUS | Status: AC
  Administered 2020-12-17: 2 g via INTRAVENOUS
  Filled 2020-12-17: qty 50

## 2020-12-17 MED ORDER — ONDANSETRON HCL 4 MG/2ML IJ SOLN
4.0000 mg | Freq: Four times a day (QID) | INTRAMUSCULAR | Status: DC | PRN
  Administered 2020-12-17: 4 mg via INTRAVENOUS
  Filled 2020-12-17: qty 2

## 2020-12-17 MED ORDER — AMLODIPINE BESYLATE 5 MG PO TABS
5.0000 mg | ORAL_TABLET | Freq: Every day | ORAL | Status: DC
  Administered 2020-12-17: 5 mg via ORAL
  Filled 2020-12-17 (×2): qty 1

## 2020-12-17 MED ORDER — ALBUTEROL SULFATE HFA 108 (90 BASE) MCG/ACT IN AERS
2.0000 | INHALATION_SPRAY | Freq: Once | RESPIRATORY_TRACT | Status: AC
  Administered 2020-12-17: 2 via RESPIRATORY_TRACT
  Filled 2020-12-17: qty 6.7

## 2020-12-17 MED ORDER — METHYLPREDNISOLONE SODIUM SUCC 125 MG IJ SOLR
60.0000 mg | Freq: Three times a day (TID) | INTRAMUSCULAR | Status: DC
  Administered 2020-12-17 – 2020-12-18 (×2): 60 mg via INTRAVENOUS
  Filled 2020-12-17 (×2): qty 2

## 2020-12-17 MED ORDER — IPRATROPIUM BROMIDE 0.02 % IN SOLN
0.5000 mg | Freq: Three times a day (TID) | RESPIRATORY_TRACT | Status: DC
  Administered 2020-12-18 – 2020-12-19 (×4): 0.5 mg via RESPIRATORY_TRACT
  Filled 2020-12-17 (×4): qty 2.5

## 2020-12-17 MED ORDER — PANTOPRAZOLE SODIUM 40 MG PO TBEC
40.0000 mg | DELAYED_RELEASE_TABLET | Freq: Two times a day (BID) | ORAL | Status: DC
  Administered 2020-12-17 – 2020-12-19 (×4): 40 mg via ORAL
  Filled 2020-12-17 (×4): qty 1

## 2020-12-17 MED ORDER — FUROSEMIDE 20 MG PO TABS
20.0000 mg | ORAL_TABLET | Freq: Every day | ORAL | Status: DC
  Administered 2020-12-17 – 2020-12-19 (×3): 20 mg via ORAL
  Filled 2020-12-17 (×3): qty 1

## 2020-12-17 MED ORDER — APIXABAN 5 MG PO TABS
5.0000 mg | ORAL_TABLET | Freq: Two times a day (BID) | ORAL | Status: DC
  Administered 2020-12-17 – 2020-12-19 (×4): 5 mg via ORAL
  Filled 2020-12-17 (×4): qty 1

## 2020-12-17 MED ORDER — AMLODIPINE-OLMESARTAN 5-40 MG PO TABS: 1.0000 | ORAL_TABLET | Freq: Every day | ORAL | Status: DC

## 2020-12-17 MED ORDER — SODIUM CHLORIDE 0.9 % IV SOLN
500.0000 mg | INTRAVENOUS | Status: DC
  Administered 2020-12-17 – 2020-12-18 (×2): 500 mg via INTRAVENOUS
  Filled 2020-12-17 (×2): qty 500

## 2020-12-17 MED ORDER — INSULIN ASPART 100 UNIT/ML ~~LOC~~ SOLN
0.0000 [IU] | Freq: Three times a day (TID) | SUBCUTANEOUS | Status: DC
  Administered 2020-12-18: 3 [IU] via SUBCUTANEOUS
  Administered 2020-12-18 – 2020-12-19 (×3): 2 [IU] via SUBCUTANEOUS

## 2020-12-17 MED ORDER — IPRATROPIUM BROMIDE 0.02 % IN SOLN
0.5000 mg | Freq: Four times a day (QID) | RESPIRATORY_TRACT | Status: DC
  Administered 2020-12-17 (×2): 0.5 mg via RESPIRATORY_TRACT
  Filled 2020-12-17 (×2): qty 2.5

## 2020-12-17 MED ORDER — OMEGA-3-ACID ETHYL ESTERS 1 G PO CAPS
2.0000 g | ORAL_CAPSULE | Freq: Two times a day (BID) | ORAL | Status: DC
Start: 2020-12-17 — End: 2020-12-19
  Administered 2020-12-17 – 2020-12-19 (×4): 2 g via ORAL
  Filled 2020-12-17 (×5): qty 2

## 2020-12-17 MED ORDER — CALCIUM CARBONATE-VITAMIN D 500-200 MG-UNIT PO TABS
1.0000 | ORAL_TABLET | Freq: Every day | ORAL | Status: DC
  Administered 2020-12-18 – 2020-12-19 (×2): 1 via ORAL
  Filled 2020-12-17 (×3): qty 1

## 2020-12-17 MED ORDER — FLUTICASONE PROPIONATE 50 MCG/ACT NA SUSP
1.0000 | Freq: Two times a day (BID) | NASAL | Status: DC
  Administered 2020-12-17 – 2020-12-19 (×3): 1 via NASAL
  Filled 2020-12-17: qty 16

## 2020-12-17 MED ORDER — ESOMEPRAZOLE MAGNESIUM 40 MG PO PACK
40.0000 mg | PACK | Freq: Two times a day (BID) | ORAL | Status: DC
Start: 2020-12-17 — End: 2020-12-17

## 2020-12-17 MED ORDER — INSULIN ASPART 100 UNIT/ML ~~LOC~~ SOLN
0.0000 [IU] | Freq: Every day | SUBCUTANEOUS | Status: DC
  Administered 2020-12-17: 2 [IU] via SUBCUTANEOUS
  Filled 2020-12-17: qty 1

## 2020-12-17 NOTE — ED Triage Notes (Signed)
Pt to er, pt states that she is here for shortness of breath, states that she has been sob for the past three days, states that she has been taking breathing treatments at home with minimal relief, pt talking in 2 word sentences, pt has audible wheeze.

## 2020-12-17 NOTE — ED Provider Notes (Signed)
[facility] EMERGENCY DEPARTMENT Provider Note   CSN: [id number] Arrival date & time: 12/17/20  1058     History Chief Complaint  Patient presents with  . Shortness of Breath    Tanya Fowler is a 70 y.o. female with history of pulmonary embolism approximately 10 years ago, on Eliquis, hypertension, diabetes, high cholesterol, obesity, asthma/COPD, presenting to the emergency department with shortness of breath for 3 days.  The patient reports dyspnea at rest associated with left-sided chest pain for 3 days.  The pain has been constant and she feels it in her back.  Is worse with deep inspiration.  It was sudden onset.  She has never had this kind of symptoms before, specifically with chest pain.  She reports she has felt short of breath in the past before.  She tells me she tested positive for Covid and had Covid lung disease several months ago.  She says she has been fully vaccinated with a booster.  She subsequently reports that she had a positive test 1 month ago in [place] for covid.    Her current symptoms began 3 days ago.  She reports that she feels like she may have some bilateral leg swelling.  She has been given herself nebulizer treatments twice a day with little relief of her dyspnea.    Denies fevers, coughing.  HPI     Past Medical History:  Diagnosis Date  . Asthma   . Bronchitis   . Diabetes mellitus without complication (HCC)    Metformin  . High cholesterol   . Hypertension   . PE (pulmonary embolism)     Patient Active Problem List   Diagnosis Date Noted  . Asthma exacerbation 12/17/2020  . Diabetes mellitus (HCC) 12/17/2020  . HTN (hypertension), benign 05/31/2011  . Acute frontal sinusitis 05/31/2011  . Acute bronchitis 05/30/2011  . COPD exacerbation (HCC) 05/30/2011    Past Surgical History:  Procedure Laterality Date  . ABDOMINAL HYSTERECTOMY    . BACK SURGERY    . BREAST SURGERY    . TONSILLECTOMY       OB History   No obstetric  history on file.     History reviewed. No pertinent family history.  Social History   Tobacco Use  . Smoking status: Never Smoker  . Smokeless tobacco: Never Used  Substance Use Topics  . Alcohol use: No  . Drug use: No    Home Medications Prior to Admission medications   Medication Sig Start Date End Date Taking? Authorizing Provider  albuterol (PROVENTIL HFA;VENTOLIN HFA) 108 (90 BASE) MCG/ACT inhaler Inhale 2 puffs into the lungs every 6 (six) hours as needed. Shortness of breath     [provider]  albuterol (PROVENTIL) (2.5 MG/3ML) 0.083% nebulizer solution Take 2.5 mg by nebulization 3 (three) times daily as needed. Shortness of breath     [provider]  amLODipine-olmesartan (AZOR) 5-40 MG tablet Take 1 tablet by mouth daily.    [provider]  apixaban (ELIQUIS) 5 MG TABS tablet Take 5 mg by mouth 2 (two) times daily.    [provider]  azithromycin (ZITHROMAX Z-PAK) 250 MG tablet 2 po day one, then 1 daily x 4 days 08/17/15   Blane Ohara, MD  budesonide-formoterol [facility]) 160-4.5 MCG/ACT inhaler Inhale 2 puffs into the lungs 2 (two) times daily.      [provider]  calcium-vitamin D (OSCAL WITH D) 250-125 MG-UNIT per tablet Take 1 tablet by mouth 2 (two) times daily.  [provider]  calcium-vitamin D 250-100 MG-UNIT per tablet Take 1 tablet by mouth daily.      [provider]  cetirizine (ZYRTEC) 10 MG tablet Take 10 mg by mouth daily.    [provider]  desloratadine (CLARINEX) 5 MG tablet Take 5 mg by mouth every morning.      [provider]  esomeprazole (NEXIUM) 40 MG packet Take 40 mg by mouth 2 (two) times daily before a meal.      [provider]  fluticasone (VERAMYST) 27.5 MCG/SPRAY nasal spray Place 1 spray into the nose 2 (two) times daily.      [provider]  Fluticasone-Salmeterol (ADVAIR) 500-50 MCG/DOSE AEPB Inhale 1 puff into the lungs 2  (two) times daily.    [provider]  furosemide (LASIX) 20 MG tablet Take 20 mg by mouth.    [provider]  HYDROcodone-acetaminophen (NORCO) 5-325 MG tablet Take 2 tablets by mouth every 4 (four) hours as needed. 08/17/15   Blane Ohara, MD  ibuprofen (ADVIL,MOTRIN) 200 MG tablet Take 200 mg by mouth every 6 (six) hours as needed. For headache     [provider]  ipratropium (ATROVENT) 0.02 % nebulizer solution Take 500 mcg by nebulization 3 (three) times daily as needed. Shortness of breath     [provider]  metFORMIN (GLUCOPHAGE) 500 MG tablet Take by mouth 2 (two) times daily with a meal.    [provider]  montelukast (SINGULAIR) 10 MG tablet Take 10 mg by mouth at bedtime.      [provider]  omega-3 acid ethyl esters (LOVAZA) 1 G capsule Take 2 g by mouth 2 (two) times daily.      [provider]  rosuvastatin (CRESTOR) 5 MG tablet Take 5 mg by mouth once a week.      [provider]  traZODone (DESYREL) 150 MG tablet Take 150 mg by mouth at bedtime as needed. For sleep     [provider]    Allergies    Bactrim [sulfamethoxazole-trimethoprim], Crestor [rosuvastatin], Neurontin [gabapentin], Topamax [topiramate], and Penicillins  Review of Systems   Review of Systems  Constitutional: Negative for chills and fever.  HENT: Negative for ear pain and sore throat.   Eyes: Negative for pain and visual disturbance.  Respiratory: Positive for shortness of breath. Negative for cough.   Cardiovascular: Positive for chest pain, palpitations and leg swelling.  Gastrointestinal: Negative for abdominal pain and vomiting.  Genitourinary: Negative for dysuria and hematuria.  Musculoskeletal: Negative for arthralgias and back pain.  Skin: Negative for color change and rash.  Neurological: Negative for syncope and headaches.  All other systems reviewed and are negative.   Physical Exam Updated Vital  Signs BP (!) 149/84   Pulse (!) 124   Temp 98.7 F (37.1 C)   Resp (!) 31   Ht 5' (1.524 m)   Wt 99.8 kg   SpO2 96%   BMI 42.97 kg/m   Physical Exam Constitutional:      General: She is not in acute distress. HENT:     Head: Normocephalic and atraumatic.  Eyes:     Conjunctiva/sclera: Conjunctivae normal.     Pupils: Pupils are equal, round, and reactive to light.  Cardiovascular:     Rate and Rhythm: Regular rhythm. Tachycardia present.     Comments: HR 110 bpm Pulmonary:     Effort: Pulmonary effort is normal. No respiratory distress.     Comments: Speaking  in short sentences Wheezing bilaterally Abdominal:     General: There is no distension.     Tenderness: There is no abdominal tenderness.  Skin:    General: Skin is warm and dry.  Neurological:     General: No focal deficit present.     Mental Status: She is alert. Mental status is at baseline.  Psychiatric:        Mood and Affect: Mood normal.        Behavior: Behavior normal.     ED Results / Procedures / Treatments   Labs (all labs ordered are listed, but only abnormal results are displayed) Labs Reviewed  CBC WITH DIFFERENTIAL/PLATELET - Abnormal; Notable for the following components:      Result Value   Eosinophils Absolute 0.7 (*)    All other components within normal limits  BASIC METABOLIC PANEL - Abnormal; Notable for the following components:   Glucose, Bld 134 (*)    All other components within normal limits  CBG MONITORING, ED - Abnormal; Notable for the following components:   Glucose-Capillary 277 (*)    All other components within normal limits  SARS CORONAVIRUS 2 BY RT PCR (HOSPITAL ORDER, PERFORMED IN [facility])  BRAIN NATRIURETIC PEPTIDE  BLOOD GAS, VENOUS  HEMOGLOBIN A1C  POC SARS CORONAVIRUS 2 AG -  ED  TROPONIN I (HIGH SENSITIVITY)    EKG EKG Interpretation  Date/Time:  Friday December 17 2020 11:12:03 EST Ventricular Rate:  106 PR Interval:    QRS  Duration: 72 QT Interval:  335 QTC Calculation: 445 R Axis:   36 Text Interpretation: Sinus tachycardia Low voltage, precordial leads Baseline wander in lead(s) V3 No STEMI Confirmed by Alvester Chourifan, Othar Curto [phone]) on 12/17/2020 11:14:40 AM   Radiology CT Angio Chest PE W and/or Wo Contrast  Result Date: 12/17/2020 CLINICAL DATA:  Chest pain and shortness of breath.  PE suspected. EXAM: CT ANGIOGRAPHY CHEST WITH CONTRAST TECHNIQUE: Multidetector CT imaging of the chest was performed using the standard protocol during bolus administration of intravenous contrast. Multiplanar CT image reconstructions and MIPs were obtained to evaluate the vascular anatomy. CONTRAST:  75mL OMNIPAQUE IOHEXOL 350 MG/ML SOLN COMPARISON:  08/17/2015. FINDINGS: Cardiovascular: Heart is enlarged. No substantial pericardial effusion. Coronary artery calcification is evident. No large central pulmonary embolus in the pulmonary outflow tract or either main pulmonary artery. No definite pulmonary embolus at the lobar pulmonary artery level. Segmental and subsegmental pulmonary arteries are not reliably evaluated due to a combination of bolus timing and extensive respiratory motion during image acquisition. Mediastinum/Nodes: Scattered small mediastinal lymph nodes are similar to prior, compatible with reactive etiology. There is no hilar lymphadenopathy. The esophagus has normal imaging features. There is no axillary lymphadenopathy. Lungs/Pleura: Stable scarring in the peripheral right lung base (69/6). Left lower lobe basilar atelectasis is similar to mildly progressed in the interval. 3 mm left lower lobe nodule on 54/6 is new in the interval. No overtly suspicious pulmonary nodule or mass. No focal airspace consolidation. No pleural effusion. Upper Abdomen: Unremarkable. Musculoskeletal: No worrisome lytic or sclerotic osseous abnormality. 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 Review of the MIP images confirms the above  findings. IMPRESSION: 1. No large central pulmonary embolus in the main or lobar pulmonary arteries. Segmental and subsegmental pulmonary arteries are not reliably evaluated due to a combination of bolus timing and extensive respiratory motion during image acquisition. 2. 3 mm left lower lobe pulmonary nodule, new in the interval. No follow-up needed if patient is  low-risk. Non-contrast chest CT can be considered in 12 months if patient is high-risk. This recommendation follows the consensus statement: Guidelines for Management of Incidental Pulmonary Nodules Detected on CT Images: From the Fleischner Society 2017; Radiology 2017; 284:228-243. 3. Bibasilar chronic atelectasis or scarring. 4. Aortic Atherosclerosis (ICD10-I70.0). Electronically Signed   By: Kennith Center M.D.   On: 12/17/2020 14:48   DG Chest Portable 1 View  Result Date: 12/17/2020 CLINICAL DATA:  Cough, dyspnea, left chest pain EXAM: PORTABLE CHEST 1 VIEW COMPARISON:  08/17/2015 chest radiograph. FINDINGS: Surgical hardware from [facility] overlies the lower cervical spine. Stable cardiomediastinal silhouette with normal heart size. No pneumothorax. No pleural effusion. No pulmonary edema. Low lung volumes. Chronic pleural-parenchymal scarring at the right costophrenic angle. No acute consolidative airspace disease. IMPRESSION: Low lung volumes. Chronic pleural-parenchymal scarring at the right costophrenic angle. No active cardiopulmonary disease. Electronically Signed   By: Delbert Phenix M.D.   On: 12/17/2020 12:21    Procedures .Critical Care Performed by: Terald Sleeper, MD Authorized by: Terald Sleeper, MD   Critical care provider statement:    Critical care time (minutes):  45   Critical care was necessary to treat or prevent imminent or life-threatening deterioration of the following conditions:  Respiratory failure   Critical care was time spent personally by me on the following activities:  Discussions with consultants,  evaluation of patient's response to treatment, examination of patient, ordering and performing treatments and interventions, ordering and review of laboratory studies, ordering and review of radiographic studies, pulse oximetry, re-evaluation of patient's condition, obtaining history from patient or surrogate and review of old charts Comments:     COPD exacerbation, IV magnesium, continuous nebulizers, repeat reassessments, bipap     Medications Ordered in ED Medications  ipratropium-albuterol (DUONEB) 0.5-2.5 (3) MG/3ML nebulizer solution 3 mL (has no administration in time range)  furosemide (LASIX) tablet 20 mg (20 mg Oral Given 12/17/20 1723)  rosuvastatin (CRESTOR) tablet 5 mg (5 mg Oral Given 12/17/20 1724)  omega-3 acid ethyl esters (LOVAZA) capsule 2 g (has no administration in time range)  traZODone (DESYREL) tablet 100 mg (has no administration in time range)  apixaban (ELIQUIS) tablet 5 mg (has no administration in time range)  calcium-vitamin D (OSCAL WITH D) 500-200 MG-UNIT per tablet 1 tablet (1 tablet Oral Not Given 12/17/20 1724)  fluticasone (FLONASE) 50 MCG/ACT nasal spray 1 spray (has no administration in time range)  montelukast (SINGULAIR) tablet 10 mg (has no administration in time range)  levalbuterol (XOPENEX) nebulizer solution 0.63 mg (0.63 mg Nebulization Given 12/17/20 1622)  ipratropium (ATROVENT) nebulizer solution 0.5 mg (0.5 mg Nebulization Given 12/17/20 1623)  budesonide (PULMICORT) nebulizer solution 0.5 mg (has no administration in time range)  methylPREDNISolone sodium succinate (SOLU-MEDROL) 125 mg/2 mL injection 60 mg (has no administration in time range)  pantoprazole (PROTONIX) EC tablet 40 mg (40 mg Oral Given 12/17/20 1724)  azithromycin (ZITHROMAX) 500 mg in sodium chloride 0.9 % 250 mL IVPB (500 mg Intravenous New Bag/Given 12/17/20 1631)  insulin aspart (novoLOG) injection 0-15 Units (0 Units Subcutaneous Not Given 12/17/20 1727)  insulin aspart (novoLOG)  injection 0-5 Units (has no administration in time range)  amLODipine (NORVASC) tablet 5 mg (5 mg Oral Given 12/17/20 1724)    And  irbesartan (AVAPRO) tablet 300 mg (300 mg Oral Given 12/17/20 1726)  ondansetron (ZOFRAN) injection 4 mg (4 mg Intravenous Given 12/17/20 1719)  methylPREDNISolone sodium succinate (SOLU-MEDROL) 125 mg/2 mL injection 125 mg (125 mg Intravenous Given  12/17/20 1149)  magnesium sulfate IVPB 2 g 50 mL (0 g Intravenous Stopped 12/17/20 1244)  albuterol (VENTOLIN HFA) 108 (90 Base) MCG/ACT inhaler 2 puff (2 puffs Inhalation Given 12/17/20 1148)  albuterol (PROVENTIL,VENTOLIN) solution continuous neb (10 mg/hr Nebulization Given 12/17/20 1233)  iohexol (OMNIPAQUE) 350 MG/ML injection 75 mL (75 mLs Intravenous Contrast Given 12/17/20 1414)  magnesium sulfate IVPB 2 g 50 mL (2 g Intravenous New Bag/Given 12/17/20 1636)    ED Course  I have reviewed the triage vital signs and the nursing notes.  Pertinent labs & imaging results that were available during my care of the patient were reviewed by me and considered in my medical decision making (see chart for details).  This patient complains of shortness of breath and left sided chest pain, abrupt onset 3 days ago. This involves an extensive number of treatment options, and is a complaint that carries with it a high risk of complications and morbidity.  The differential diagnosis includes PE vs ACS vs PTX vs PNA vs covid infection vs anemia vs other  YIRAN SHABAN was evaluated in Emergency Department on 12/17/2020 for the symptoms described in the history of present illness. She was evaluated in the context of the global COVID-19 pandemic, which necessitated consideration that the patient might be at risk for infection with the SARS-CoV-2 virus that causes COVID-19. Institutional protocols and algorithms that pertain to the evaluation of patients at risk for COVID-19 are in a state of rapid change based on information released by regulatory  bodies including the CDC and federal and state organizations. These policies and algorithms were followed during the patient's care in the ED.   I ordered, reviewed, and interpreted labs, which included VBG, Trop, BNP, Covid (negative), BMP, CBC.  No significant findings.  Specifically no indication of ACS, anemia, electrolyte derangement, severe pCO2 retention, or CHF. I ordered medication IV magnesium, IV albuterol, IV duonebs, IV steroids for suspected COPD exacerbation I ordered imaging studies which included dg chest and CT PE I independently visualized and interpreted imaging which showed no large PE, incidental left pulm nodule, and the monitor tracing which showed sinus tachycardia  No localizing symptoms to suggest sepsis or bacterial infection or surgical emergency per her exam at the time of admission.  WBC 6.0 and afebrile.   Clinical Course as of 12/17/20 Naida Sleight Dec 17, 2020  1405 Symptomatically feeling much better after 1 hour nebulizer.  However she remains quite  tachycardic with HR 120s and jitters now - which may be from albuterol.  We'll reassess her HR while awaiting CT PE to evaluate for PE and PNA.    [MT]  1516  IMPRESSION: 1. No large central pulmonary embolus in the main or lobar pulmonary arteries. Segmental and subsegmental pulmonary arteries are not reliably evaluated due to a combination of bolus timing and extensive respiratory motion during image acquisition. 2. 3 mm left lower lobe pulmonary nodule, new in the interval. No follow-up needed if patient is low-risk. Non-contrast chest CT can be considered in 12 months if patient is high-risk. This recommendation follows the consensus statement: Guidelines for Management of Incidental Pulmonary Nodules Detected on CT Images: From the Fleischner Society 2017; Radiology 2017; 284:228-243. 3. Bibasilar chronic atelectasis or scarring. 4. Aortic Atherosclerosis (ICD10-I70.0). [MT]  1532 CT  PE does not show  evident cause of dyspnea.  Pt with continued HR in 120's, RR 28, speaking in short sentences.  She says she's feeling better but is clearly continuing to  have significant work of breathing.  I advised we admit for a trial of bipap, and continued duonebs overnight.  She verbalized agreement. [MT]  1535 Signed out to hospitalist [MT]  1535 Called RT to request a trial of bipap - they will come initiate [MT]    Clinical Course User Index [MT] Triana Coover, Kermit Balo, MD    Final Clinical Impression(s) / ED Diagnoses Final diagnoses:  COPD exacerbation [facility])    Rx / DC Orders ED Discharge Orders    None       Terald Sleeper, MD 12/17/20 [phone]

## 2020-12-17 NOTE — H&P (Addendum)
TRH H&P   Patient Demographics:    Tanya Fowler, is a 70 y.o. female  MRN: [id number]   DOB - 02/06/1951  Admit Date - 12/17/2020  Outpatient Primary MD for the patient is System, Provider Not In  Referring MD/NP/PA: Dr Renaye Rakers  Patient coming from: home  Chief Complaint  Patient presents with  . Shortness of Breath      HPI:    Tanya Fowler  is a 69 y.o. 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 -Patient with known history of severe persistent asthma, she reports she never intubated or hospitalized because of asthma, but had few ED visits due to that, patient report worsening dyspnea over last 3 days, with some cough, productive green in color, as well she did report chest pain related to cough, she denies fever, chills, nausea or vomiting, patient fully recovered from [facility] August 2020, and reports she is fully vaccinated with a booster, reports she did test positive for Covid 1 month ago at [place].  She denies any history of smoking, reports she had some wheezing at home as well, report dyspnea mainly exertional, but currently even at rest. -In ED patient with no hypoxia, but she has increased work of breathing, CTA with no evidence of PE, had increased work of breathing so she was started on BiPAP.    Review of systems:    In addition to the HPI above,  No Fever-chills, No Headache, No changes with Vision or hearing, No problems swallowing food or Liquids, Presents with cough, minimally productive, shortness of breath, and chest pain related to cough. No Abdominal pain, No Nausea or Vommitting,  Bowel movements are regular, No Blood in stool or Urine, No dysuria, No new skin rashes or bruises, No new joints pains-aches,  No new weakness, tingling, numbness in any extremity, No recent weight gain or loss, No polyuria, polydypsia or polyphagia, No significant Mental Stressors.  A full 10 point Review of Systems was done, except as stated above, all other Review of Systems were negative.   With Past History of the following :    Past Medical History:  Diagnosis Date  . Asthma   . Bronchitis   . Diabetes mellitus without complication (HCC)    Metformin  . High cholesterol   . Hypertension   . PE (pulmonary embolism)       Past Surgical History:  Procedure Laterality Date  . ABDOMINAL HYSTERECTOMY    . BACK SURGERY    . BREAST SURGERY    . TONSILLECTOMY        Social History:     Social History   Tobacco Use  . Smoking  status: Never Smoker  . Smokeless tobacco: Never Used  Substance Use Topics  . Alcohol use: No        Family History :    History reviewed. No pertinent family history.   Home Medications:   Prior to Admission medications   Medication Sig Start Date End Date Taking? Authorizing Provider  albuterol (PROVENTIL HFA;VENTOLIN HFA) 108 (90 BASE) MCG/ACT inhaler Inhale 2 puffs into the lungs every 6 (six) hours as needed. Shortness of breath     [provider]  albuterol (PROVENTIL) (2.5 MG/3ML) 0.083% nebulizer solution Take 2.5 mg by nebulization 3 (three) times daily as needed. Shortness of breath     [provider]  amLODipine-olmesartan (AZOR) 5-40 MG tablet Take 1 tablet by mouth daily.    [provider]  apixaban (ELIQUIS) 5 MG TABS tablet Take 5 mg by mouth 2 (two) times daily.    [provider]  azithromycin (ZITHROMAX Z-PAK) 250 MG tablet 2 po day one, then 1 daily x 4 days 08/17/15   Blane Ohara, MD  budesonide-formoterol [facility]) 160-4.5 MCG/ACT inhaler Inhale 2 puffs into the lungs 2  (two) times daily.      [provider]  calcium-vitamin D (OSCAL WITH D) 250-125 MG-UNIT per tablet Take 1 tablet by mouth 2 (two) times daily.      [provider]  calcium-vitamin D 250-100 MG-UNIT per tablet Take 1 tablet by mouth daily.      [provider]  cetirizine (ZYRTEC) 10 MG tablet Take 10 mg by mouth daily.    [provider]  desloratadine (CLARINEX) 5 MG tablet Take 5 mg by mouth every morning.      [provider]  esomeprazole (NEXIUM) 40 MG packet Take 40 mg by mouth 2 (two) times daily before a meal.      [provider]  fluticasone (VERAMYST) 27.5 MCG/SPRAY nasal spray Place 1 spray into the nose 2 (two) times daily.      [provider]  Fluticasone-Salmeterol (ADVAIR) 500-50 MCG/DOSE AEPB Inhale 1 puff into the lungs 2 (two) times daily.    [provider]  furosemide (LASIX) 20 MG tablet Take 20 mg by mouth.    [provider]  HYDROcodone-acetaminophen (NORCO) 5-325 MG tablet Take 2 tablets by mouth every 4 (four) hours as needed. 08/17/15   Blane Ohara, MD  ibuprofen (ADVIL,MOTRIN) 200 MG tablet Take 200 mg by mouth every 6 (six) hours as needed. For headache     [provider]  ipratropium (ATROVENT) 0.02 % nebulizer solution Take 500 mcg by nebulization 3 (three) times daily as needed. Shortness of breath     [provider]  metFORMIN (GLUCOPHAGE) 500 MG tablet Take by mouth 2 (two) times daily with a meal.    [provider]  montelukast (SINGULAIR) 10 MG tablet Take 10 mg by mouth at bedtime.      [provider]  omega-3 acid ethyl esters (LOVAZA) 1 G capsule Take 2 g by mouth 2 (two) times daily.      [provider]  rosuvastatin (CRESTOR) 5 MG tablet Take 5 mg by mouth once a week.      [provider]  traZODone (DESYREL) 150 MG tablet Take 150 mg by mouth at bedtime as needed. For sleep     [provider]      Allergies:     Allergies  Allergen Reactions  . Bactrim [Sulfamethoxazole-Trimethoprim] Itching  . Crestor [Rosuvastatin] Other (  See Comments)  . Neurontin [Gabapentin] Nausea And Vomiting  . Topamax [Topiramate] Other (See Comments)  . Penicillins Rash     Physical Exam:   Vitals  Blood pressure (!) 149/84, pulse (!) 124, temperature 98.7 F (37.1 C), resp. rate (!) 31, height 5' (1.524 m), weight 99.8 kg, SpO2 96 %.   1. General well developed female, laying in bed, mildly tachypneic, but no use of accessory muscles  2. Normal affect and insight, Not Suicidal or Homicidal, Awake Alert, Oriented X 3.  3. No F.N deficits, ALL C.Nerves Intact, Strength 5/5 all 4 extremities, Sensation intact all 4 extremities, Plantars down going.  4. Ears and Eyes appear Normal, Conjunctivae clear, PERRLA. Moist Oral Mucosa.  5. Supple Neck, No JVD, No cervical lymphadenopathy appriciated, No Carotid Bruits.  6. Symmetrical Chest wall movement, diminished air entry at the bases, with mild wheezing, mildly tachypneic, but no accessory muscles.  7.  Tachycardic, No Gallops, Rubs or Murmurs, No Parasternal Heave.  8. Positive Bowel Sounds, Abdomen Soft, No tenderness, No organomegaly appriciated,No rebound -guarding or rigidity.  9.  No Cyanosis, Normal Skin Turgor, No Skin Rash or Bruise.  10. Good muscle tone,  joints appear normal , no effusions, Normal ROM.  11. No Palpable Lymph Nodes in Neck or Axillae    Data Review:    CBC Recent Labs  Lab 12/17/20 1124  WBC 6.0  HGB 13.7  HCT 42.6  PLT 233  MCV 96.8  MCH 31.1  MCHC 32.2  RDW 14.9  LYMPHSABS 2.0  MONOABS 0.4  EOSABS 0.7*  BASOSABS 0.1   ------------------------------------------------------------------------------------------------------------------  Chemistries  Recent Labs  Lab 12/17/20 1124  NA 139  K 3.8  CL 107  CO2 24  GLUCOSE 134*  BUN 9  CREATININE 0.67  CALCIUM 9.3    ------------------------------------------------------------------------------------------------------------------ estimated creatinine clearance is 68.4 mL/min (by C-G formula based on SCr of 0.67 mg/dL). ------------------------------------------------------------------------------------------------------------------ No results for input(s): TSH, T4TOTAL, T3FREE, THYROIDAB in the last 72 hours.  Invalid input(s): FREET3  Coagulation profile No results for input(s): INR, PROTIME in the last 168 hours. ------------------------------------------------------------------------------------------------------------------- No results for input(s): DDIMER in the last 72 hours. -------------------------------------------------------------------------------------------------------------------  Cardiac Enzymes No results for input(s): CKMB, TROPONINI, MYOGLOBIN in the last 168 hours.  Invalid input(s): CK ------------------------------------------------------------------------------------------------------------------    Component Value Date/Time   BNP 34.0 12/17/2020 1124     ---------------------------------------------------------------------------------------------------------------  Urinalysis    Component Value Date/Time   COLORURINE YELLOW 02/16/2008 1854   APPEARANCEUR CLEAR 02/16/2008 1854   LABSPEC 1.015 02/16/2008 1854   PHURINE 7.0 02/16/2008 1854   GLUCOSEU NEGATIVE 02/16/2008 1854   HGBUR NEGATIVE 02/16/2008 1854   BILIRUBINUR NEGATIVE 02/16/2008 1854   KETONESUR NEGATIVE 02/16/2008 1854   PROTEINUR NEGATIVE 02/16/2008 1854   UROBILINOGEN 0.2 02/16/2008 1854   NITRITE NEGATIVE 02/16/2008 1854   LEUKOCYTESUR TRACE (A) 02/16/2008 1854    ----------------------------------------------------------------------------------------------------------------   Imaging Results:    CT Angio Chest PE W and/or Wo Contrast  Result Date: 12/17/2020 CLINICAL DATA:  Chest pain and  shortness of breath.  PE suspected. EXAM: CT ANGIOGRAPHY CHEST WITH CONTRAST TECHNIQUE: Multidetector CT imaging of the chest was performed using the standard protocol during bolus administration of intravenous contrast. Multiplanar CT image reconstructions and MIPs were obtained to evaluate the vascular anatomy. CONTRAST:  32mL OMNIPAQUE IOHEXOL 350 MG/ML SOLN COMPARISON:  08/17/2015. FINDINGS: Cardiovascular: Heart is enlarged. No substantial pericardial effusion. Coronary artery calcification is evident. No large central pulmonary embolus in the pulmonary outflow tract or  either main pulmonary artery. No definite pulmonary embolus at the lobar pulmonary artery level. Segmental and subsegmental pulmonary arteries are not reliably evaluated due to a combination of bolus timing and extensive respiratory motion during image acquisition. Mediastinum/Nodes: Scattered small mediastinal lymph nodes are similar to prior, compatible with reactive etiology. There is no hilar lymphadenopathy. The esophagus has normal imaging features. There is no axillary lymphadenopathy. Lungs/Pleura: Stable scarring in the peripheral right lung base (69/6). Left lower lobe basilar atelectasis is similar to mildly progressed in the interval. 3 mm left lower lobe nodule on 54/6 is new in the interval. No overtly suspicious pulmonary nodule or mass. No focal airspace consolidation. No pleural effusion. Upper Abdomen: Unremarkable. Musculoskeletal: No worrisome lytic or sclerotic osseous abnormality. 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 Review of the MIP images confirms the above findings. IMPRESSION: 1. No large central pulmonary embolus in the main or lobar pulmonary arteries. Segmental and subsegmental pulmonary arteries are not reliably evaluated due to a combination of bolus timing and extensive respiratory motion during image acquisition. 2. 3 mm left lower lobe pulmonary nodule, new in the interval. No follow-up needed  if patient is low-risk. Non-contrast chest CT can be considered in 12 months if patient is high-risk. This recommendation follows the consensus statement: Guidelines for Management of Incidental Pulmonary Nodules Detected on CT Images: From the Fleischner Society 2017; Radiology 2017; 284:228-243. 3. Bibasilar chronic atelectasis or scarring. 4. Aortic Atherosclerosis (ICD10-I70.0). Electronically Signed   By: Kennith Center M.D.   On: 12/17/2020 14:48   DG Chest Portable 1 View  Result Date: 12/17/2020 CLINICAL DATA:  Cough, dyspnea, left chest pain EXAM: PORTABLE CHEST 1 VIEW COMPARISON:  08/17/2015 chest radiograph. FINDINGS: Surgical hardware from ACD[facility] overlies the lower cervical spine. Stable cardiomediastinal silhouette with normal heart size. No pneumothorax. No pleural effusion. No pulmonary edema. Low lung volumes. Chronic pleural-parenchymal scarring at the right costophrenic angle. No acute consolidative airspace disease. IMPRESSION: Low lung volumes. Chronic pleural-parenchymal scarring at the right costophrenic angle. No active cardiopulmonary disease. Electronically Signed   By: Delbert Phenix M.D.   On: 12/17/2020 12:21    My personal review of EKG: Rhythm sinus achycardia, Rate  106 /min, QTc 445, no Acute ST changes   Assessment & Plan:    Active Problems:   HTN (hypertension), benign   Asthma exacerbation   Diabetes mellitus (HCC)   Asthma exacerbation -Patient with known history of severe persistent asthma, following with pulmonary at [facility] [place], reports she has been compliant with her medications. -She has no hypoxia, but she has tachypnea and some increased work of breathing initially, currently improved after receiving multiple nebulizer treatment and IV steroids. -She is currently on BiPAP, appears comfortable, she will be started on IV Solu-Medrol, budsonide nebulizer 0.5 mg twice daily, Xopenex (better HER significant tachycardia in the one thirties), and  ipratropium, will continue with Flonase, given productive phlegm I will start her on azithromycin as well, continue with home Singulair.  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  Code Status full  Likely DC to  home  Condition GUARDED    Consults called: none    Admission status: observation    Time spent in minutes : 65 minutes   Huey Bienenstockawood Malyia Moro M.D on 12/17/2020 at 5:21 PM   [facility] - Office  [phone]

## 2020-12-18 ENCOUNTER — Encounter (HOSPITAL_COMMUNITY): Payer: Self-pay | Admitting: Internal Medicine

## 2020-12-18 DIAGNOSIS — J4551 Severe persistent asthma with (acute) exacerbation: Secondary | ICD-10-CM | POA: Diagnosis not present

## 2020-12-18 DIAGNOSIS — E119 Type 2 diabetes mellitus without complications: Secondary | ICD-10-CM | POA: Diagnosis not present

## 2020-12-18 LAB — GLUCOSE, CAPILLARY
Glucose-Capillary: 150 mg/dL — ABNORMAL HIGH (ref 70–99)
Glucose-Capillary: 137 mg/dL — ABNORMAL HIGH (ref 70–99)
Glucose-Capillary: 169 mg/dL — ABNORMAL HIGH (ref 70–99)
Glucose-Capillary: 191 mg/dL — ABNORMAL HIGH (ref 70–99)

## 2020-12-18 MED ORDER — ACETAMINOPHEN 325 MG PO TABS
650.0000 mg | ORAL_TABLET | Freq: Four times a day (QID) | ORAL | Status: DC | PRN
  Administered 2020-12-18: 650 mg via ORAL
  Filled 2020-12-18: qty 2

## 2020-12-18 MED ORDER — DIAZEPAM 2 MG PO TABS: 2.0000 mg | ORAL_TABLET | Freq: Every evening | ORAL | Status: DC | PRN

## 2020-12-18 MED ORDER — AMLODIPINE BESYLATE 5 MG PO TABS
5.0000 mg | ORAL_TABLET | Freq: Every day | ORAL | Status: DC
  Administered 2020-12-18 – 2020-12-19 (×2): 5 mg via ORAL
  Filled 2020-12-18: qty 1

## 2020-12-18 MED ORDER — FAMOTIDINE 20 MG PO TABS
20.0000 mg | ORAL_TABLET | Freq: Every day | ORAL | Status: DC
  Administered 2020-12-18 – 2020-12-19 (×2): 20 mg via ORAL
  Filled 2020-12-18 (×2): qty 1

## 2020-12-18 MED ORDER — METHYLPREDNISOLONE SODIUM SUCC 40 MG IJ SOLR
40.0000 mg | Freq: Three times a day (TID) | INTRAMUSCULAR | Status: DC
  Administered 2020-12-18 – 2020-12-19 (×3): 40 mg via INTRAVENOUS
  Filled 2020-12-18 (×3): qty 1

## 2020-12-18 MED ORDER — LORATADINE 10 MG PO TABS
10.0000 mg | ORAL_TABLET | Freq: Every day | ORAL | Status: DC
  Administered 2020-12-18: 10 mg via ORAL
  Filled 2020-12-18: qty 1

## 2020-12-18 MED ORDER — MIRTAZAPINE 15 MG PO TABS
7.5000 mg | ORAL_TABLET | Freq: Every day | ORAL | Status: DC
  Administered 2020-12-18: 7.5 mg via ORAL
  Filled 2020-12-18: qty 1

## 2020-12-18 MED ORDER — IRBESARTAN 150 MG PO TABS
150.0000 mg | ORAL_TABLET | Freq: Every day | ORAL | Status: DC
  Administered 2020-12-18 – 2020-12-19 (×2): 150 mg via ORAL
  Filled 2020-12-18: qty 1

## 2020-12-18 MED ORDER — LEVOCETIRIZINE DIHYDROCHLORIDE 5 MG PO TABS: 5.0000 mg | ORAL_TABLET | Freq: Every evening | ORAL | Status: DC

## 2020-12-18 MED ORDER — INSULIN ASPART 100 UNIT/ML ~~LOC~~ SOLN
8.0000 [IU] | Freq: Three times a day (TID) | SUBCUTANEOUS | Status: DC
  Administered 2020-12-18: 8 [IU] via SUBCUTANEOUS

## 2020-12-18 MED ORDER — INSULIN ASPART 100 UNIT/ML ~~LOC~~ SOLN
5.0000 [IU] | Freq: Three times a day (TID) | SUBCUTANEOUS | Status: DC
  Administered 2020-12-18 – 2020-12-19 (×3): 5 [IU] via SUBCUTANEOUS

## 2020-12-18 NOTE — Progress Notes (Signed)
PROGRESS NOTE   COLENE MINES  IRW:[id number] DOB: 1950/12/10 DOA: 12/17/2020 PCP: System, Provider Not In   Chief Complaint  Patient presents with  . Shortness of Breath   Level of care: Telemetry  Brief Admission History:  70 y.o. 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  Assessment & Plan:   Principal Problem:   Asthma exacerbation Active Problems:   HTN (hypertension), benign   Diabetes mellitus (HCC)   Asthma Exacerbation - Pt is responding favorably to IV steroids and bronchodilators.  She is slowly improving.  Continue current management and hopeful that she will be able to go home tomorrow.    Type 2 DM with steroid induced hyperglycemia - Added prandial coverage and continue SSI and frequent CBG monitoring.    HTN - stable.    DVT prophylaxis: enoxaparin  Code Status: full  Family Communication: permission not granted Disposition:  Status is: Inpatient  Remains inpatient appropriate because:Inpatient level of care appropriate due to severity of illness  Dispo: The patient is from: Home              Anticipated d/c is to: Home              Anticipated d/c date is: 1 day              Patient currently is not medically stable to d/c.   Difficult to place patient No  Consultants:   N/a   Procedures:   n/a  Antimicrobials:   azithromycin 2/4>  Subjective: Pt reports less severe SOB and distress than when she presented yesterday.    Objective: Vitals:   12/17/20 2335 12/18/20 0517 12/18/20 0717 12/18/20 0723  BP:  136/68    Pulse: 84 77    Resp: 20 19    Temp:  98.3 F (36.8 C)    TempSrc:      SpO2: 98% 97% 95% 95%  Weight:      Height:        Intake/Output Summary  (Last 24 hours) at 12/18/2020 1021 Last data filed at 12/18/2020 0400 Gross per 24 hour  Intake 531.48 ml  Output --  Net 531.48 ml   Filed Weights   12/17/20 1106 12/17/20 2252  Weight: 99.8 kg 105.6 kg    Examination:  General exam: Appears calm and comfortable. Pt speaking short sentences and hoarse.  Respiratory system: wheezing heard with expiration and inspiration bilaterally. Moderate increased work of breathing. Cardiovascular system: normal S1 & S2 heard.  Gastrointestinal system: Abdomen is nondistended, soft and nontender. No organomegaly or masses felt. Normal bowel sounds heard. Central nervous system: Alert and oriented. No focal neurological deficits. Extremities: Symmetric 5 x 5 power. Skin: No rashes, lesions or ulcers Psychiatry: Judgement and insight appear normal. Mood & affect appropriate.   Data Reviewed: I have personally reviewed following labs and imaging studies  CBC: Recent Labs  Lab 12/17/20 1124  WBC 6.0  NEUTROABS 2.8  HGB 13.7  HCT 42.6  MCV 96.8  PLT 233    Basic Metabolic Panel: Recent Labs  Lab 12/17/20 1124  NA 139  K 3.8  CL 107  CO2 24  GLUCOSE 134*  BUN 9  CREATININE 0.67  CALCIUM 9.3    GFR: Estimated Creatinine  Clearance: 72.2 mL/min (by C-G formula based on SCr of 0.67 mg/dL).  Liver Function Tests: No results for input(s): AST, ALT, ALKPHOS, BILITOT, PROT, ALBUMIN in the last 168 hours.  CBG: Recent Labs  Lab 12/17/20 1817 12/17/20 2202 12/18/20 0818  GLUCAP 277* 222* 150*    Recent Results (from the past 240 hour(s))  SARS Coronavirus 2 by RT PCR (hospital order, performed in [facility] hospital lab) Nasopharyngeal Nasopharyngeal Swab     Status: None   Collection Time: 12/17/20  3:30 PM   Specimen: Nasopharyngeal Swab  Result Value Ref Range Status   SARS Coronavirus 2 NEGATIVE NEGATIVE Final    Comment: (NOTE) SARS-CoV-2 target nucleic acids are NOT DETECTED.  The SARS-CoV-2 RNA is generally  detectable in upper and lower respiratory specimens during the acute phase of infection. The lowest concentration of SARS-CoV-2 viral copies this assay can detect is 250 copies / mL. A negative result does not preclude SARS-CoV-2 infection and should not be used as the sole basis for treatment or other patient management decisions.  A negative result may occur with improper specimen collection / handling, submission of specimen other than nasopharyngeal swab, presence of viral mutation(s) within the areas targeted by this assay, and inadequate number of viral copies (<250 copies / mL). A negative result must be combined with clinical observations, patient history, and epidemiological information.  Fact Sheet for Patients:   BoilerBrush.com.cy  Fact Sheet for Healthcare Providers: https://pope.com/  This test is not yet approved or  cleared by the [place] FDA and has been authorized for detection and/or diagnosis of SARS-CoV-2 by FDA under an Emergency Use Authorization (EUA).  This EUA will remain in effect (meaning this test can be used) for the duration of the COVID-19 declaration under Section 564(b)(1) of the Act, 21 U.S.C. section 360bbb-3(b)(1), unless the authorization is terminated or revoked sooner.  Performed at [facility], [address], [place], [place] [address]      Radiology Studies: CT Angio Chest PE W and/or Wo Contrast  Result Date: 12/17/2020 CLINICAL DATA:  Chest pain and shortness of breath.  PE suspected. EXAM: CT ANGIOGRAPHY CHEST WITH CONTRAST TECHNIQUE: Multidetector CT imaging of the chest was performed using the standard protocol during bolus administration of intravenous contrast. Multiplanar CT image reconstructions and MIPs were obtained to evaluate the vascular anatomy. CONTRAST:  56mL OMNIPAQUE IOHEXOL 350 MG/ML SOLN COMPARISON:  08/17/2015. FINDINGS: Cardiovascular: Heart is enlarged. No  substantial pericardial effusion. Coronary artery calcification is evident. No large central pulmonary embolus in the pulmonary outflow tract or either main pulmonary artery. No definite pulmonary embolus at the lobar pulmonary artery level. Segmental and subsegmental pulmonary arteries are not reliably evaluated due to a combination of bolus timing and extensive respiratory motion during image acquisition. Mediastinum/Nodes: Scattered small mediastinal lymph nodes are similar to prior, compatible with reactive etiology. There is no hilar lymphadenopathy. The esophagus has normal imaging features. There is no axillary lymphadenopathy. Lungs/Pleura: Stable scarring in the peripheral right lung base (69/6). Left lower lobe basilar atelectasis is similar to mildly progressed in the interval. 3 mm left lower lobe nodule on 54/6 is new in the interval. No overtly suspicious pulmonary nodule or mass. No focal airspace consolidation. No pleural effusion. Upper Abdomen: Unremarkable. Musculoskeletal: No worrisome lytic or sclerotic osseous abnormality. 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 Review of the MIP images confirms the above findings. IMPRESSION: 1. No large central pulmonary embolus in the main or lobar pulmonary arteries. Segmental and subsegmental  pulmonary arteries are not reliably evaluated due to a combination of bolus timing and extensive respiratory motion during image acquisition. 2. 3 mm left lower lobe pulmonary nodule, new in the interval. No follow-up needed if patient is low-risk. Non-contrast chest CT can be considered in 12 months if patient is high-risk. This recommendation follows the consensus statement: Guidelines for Management of Incidental Pulmonary Nodules Detected on CT Images: From the Fleischner Society 2017; Radiology 2017; 284:228-243. 3. Bibasilar chronic atelectasis or scarring. 4. Aortic Atherosclerosis (ICD10-I70.0). Electronically Signed   By: Kennith Center M.D.   On:  12/17/2020 14:48   DG Chest Portable 1 View  Result Date: 12/17/2020 CLINICAL DATA:  Cough, dyspnea, left chest pain EXAM: PORTABLE CHEST 1 VIEW COMPARISON:  08/17/2015 chest radiograph. FINDINGS: Surgical hardware from ACDF overlies the lower cervical spine. Stable cardiomediastinal silhouette with normal heart size. No pneumothorax. No pleural effusion. No pulmonary edema. Low lung volumes. Chronic pleural-parenchymal scarring at the right costophrenic angle. No acute consolidative airspace disease. IMPRESSION: Low lung volumes. Chronic pleural-parenchymal scarring at the right costophrenic angle. No active cardiopulmonary disease. Electronically Signed   By: Delbert Phenix M.D.   On: 12/17/2020 12:21   Scheduled Meds: . irbesartan  150 mg Oral Daily   And  . amLODipine  5 mg Oral Daily  . apixaban  5 mg Oral BID  . budesonide (PULMICORT) nebulizer solution  0.5 mg Nebulization BID  . calcium-vitamin D  1 tablet Oral Q breakfast  . famotidine  20 mg Oral Daily  . fluticasone  1 spray Each Nare BID  . furosemide  20 mg Oral Daily  . insulin aspart  0-15 Units Subcutaneous TID WC  . insulin aspart  0-5 Units Subcutaneous QHS  . insulin aspart  5 Units Subcutaneous TID WC  . ipratropium  0.5 mg Nebulization TID  . levalbuterol  0.63 mg Nebulization TID  . loratadine  10 mg Oral q1800  . methylPREDNISolone (SOLU-MEDROL) injection  40 mg Intravenous Q8H  . mirtazapine  7.5 mg Oral QHS  . montelukast  10 mg Oral QHS  . omega-3 acid ethyl esters  2 g Oral BID  . pantoprazole  40 mg Oral BID AC  . rosuvastatin  5 mg Oral Weekly   Continuous Infusions: . azithromycin 500 mg (12/17/20 1631)    LOS: 1 day   Time spent: 37 mins   Kyesha Balla Laural Benes, MD How to contact the [facility] Attending or Consulting provider 7A - 7P or covering provider during after hours 7P -7A, for this patient?  1. Check the care team in [facility] and look for a) attending/consulting TR[facility] provider listed and b) the [facility] team  listed 2. Log into [email or web address] and use [facility] universal password to access. If you do not have the password, please contact the hospital operator. 3. Locate the [facility] provider you are looking for under [facility] and page to a number that you can be directly reached. 4. If you still have difficulty reaching the provider, please page the [facility] (Director on Call) for the Hospitalists listed on amion for assistance.  12/18/2020, 10:21 AM   '

## 2020-12-19 DIAGNOSIS — J4551 Severe persistent asthma with (acute) exacerbation: Secondary | ICD-10-CM | POA: Diagnosis not present

## 2020-12-19 DIAGNOSIS — E119 Type 2 diabetes mellitus without complications: Secondary | ICD-10-CM | POA: Diagnosis not present

## 2020-12-19 DIAGNOSIS — I10 Essential (primary) hypertension: Secondary | ICD-10-CM | POA: Diagnosis not present

## 2020-12-19 LAB — GLUCOSE, CAPILLARY
Glucose-Capillary: 179 mg/dL — ABNORMAL HIGH (ref 70–99)
Glucose-Capillary: 139 mg/dL — ABNORMAL HIGH (ref 70–99)
Glucose-Capillary: 123 mg/dL — ABNORMAL HIGH (ref 70–99)

## 2020-12-19 MED ORDER — METHYLPREDNISOLONE SODIUM SUCC 40 MG IJ SOLR: 40.0000 mg | Freq: Every day | INTRAMUSCULAR | Status: DC

## 2020-12-19 MED ORDER — OMEPRAZOLE 20 MG PO CPDR: 20.0000 mg | DELAYED_RELEASE_CAPSULE | Freq: Every day | ORAL | 0 refills | Status: AC

## 2020-12-19 MED ORDER — PREDNISONE 20 MG PO TABS: ORAL_TABLET | ORAL | 0 refills | Status: AC

## 2020-12-19 NOTE — Discharge Summary (Signed)
Physician Discharge Summary  Tanya Fowler ZOX:[id number] DOB: [birth year] DOA: 12/17/2020  Admit date: 12/17/2020 Discharge date: 12/19/2020  Admitted From:  Home  Disposition: Home   Recommendations for Outpatient Follow-up:  1. Follow up with PCP in 1 weeks  Discharge Condition: STABLE   CODE STATUS: FULL    Brief Hospitalization Summary: Please see all hospital notes, images, labs for full details of the hospitalization. ADMISSION HPI:  70 y.o. 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 -Patient with known history of severe persistent asthma, she reports she never intubated or hospitalized because of asthma, but had few ED visits due to that, patient report worsening dyspnea over last 3 days, with some cough, productive green in color, as well she did report chest pain related to cough, she denies fever, chills, nausea or vomiting, [facility], and reports she is fully vaccinated with a booster, reports she did test positive for Covid 1 month ago at [place].  She denies any history of smoking, reports she had some wheezing at home as well, report dyspnea mainly exertional, but currently even at rest. -In ED patient with no hypoxia, but she has increased work of breathing, CTA with no evidence of PE, had increased work of breathing so she was started on BiPAP.  Hospital Course  Pt was admitted for an asthma exacerbation and she was given IV steroids and bronchodilators.  She responded well to these therapies and she is feeling well enough to go home today.  She reports that she will follow up with her PCP and pulmonologist.  She will discharge home on a prednisone taper in stable  condition.  Her other medical conditions have remained stable.   Discharge Diagnoses:  Principal Problem:   Asthma exacerbation Active Problems:   HTN (hypertension), benign   Diabetes mellitus (HCC)   Discharge Instructions:  Allergies as of 12/19/2020      Reactions   Bactrim [sulfamethoxazole-trimethoprim] Itching   Crestor [rosuvastatin] Other (See Comments)   Neurontin [gabapentin] Nausea And Vomiting   Topamax [topiramate] Other (See Comments)   Penicillins Rash      Medication List    STOP taking these medications   amLODipine 10 MG tablet Commonly known as: NORVASC   calcium-vitamin D 250-100 MG-UNIT tablet   Fluticasone-Salmeterol 500-50 MCG/DOSE Aepb Commonly known as: ADVAIR   ipratropium 0.02 % nebulizer solution Commonly known as: ATROVENT   levalbuterol 45 MCG/ACT inhaler Commonly known as: XOPENEX HFA   omega-3 acid ethyl esters 1 g capsule Commonly known as: LOVAZA   rosuvastatin 5 MG tablet Commonly known as: CRESTOR   topiramate 100 MG tablet Commonly known as: TOPAMAX   traZODone 150 MG tablet Commonly known as: DESYREL     TAKE these medications   albuterol (2.5 MG/3ML) 0.083% nebulizer solution Commonly known as: PROVENTIL Take 2.5 mg by nebulization 3 (three) times daily as needed. Shortness of breath   albuterol 108 (90 Base) MCG/ACT inhaler Commonly known as: VENTOLIN HFA Inhale 2 puffs into the lungs every 6 (six) hours as needed. Shortness of breath   amLODipine-olmesartan 5-40 MG tablet Commonly known as: AZOR Take 1 tablet by mouth daily.   apixaban 5 MG Tabs tablet Commonly known as: ELIQUIS Take 5 mg by  mouth 2 (two) times daily.   atorvastatin 40 MG tablet Commonly known as: LIPITOR Take 40 mg by mouth daily.   betamethasone dipropionate 0.05 % cream APPLY TO THE AFFECTED AREA(S) topically TWICE DAILY FOR 14 DAYS   Breztri Aerosphere 160-9-4.8 MCG/ACT Aero Generic drug: Budeson-Glycopyrrol-Formoterol Inhale 2  puffs into the lungs 2 (two) times daily.   budesonide-formoterol 160-4.5 MCG/ACT inhaler Commonly known as: SYMBICORT Inhale 2 puffs into the lungs 2 (two) times daily.   Calcium Citrate 250 MG Tabs 1 tab(s)   calcium-vitamin D 250-125 MG-UNIT tablet Commonly known as: OSCAL WITH D Take 1 tablet by mouth 2 (two) times daily.   Cholecalciferol 25 MCG (1000 UT) tablet Take by mouth.   diazepam 2 MG tablet Commonly known as: VALIUM Take 2 mg by mouth at bedtime as needed for sedation.   Dupixent 300 MG/2ML prefilled syringe Generic drug: dupilumab Inject 300 mg into the skin every 14 (fourteen) days.   EPINEPHrine 0.3 mg/0.3 mL Soaj injection Commonly known as: EPI-PEN Inject 0.3 mg into the muscle as needed.   famotidine 20 MG tablet Commonly known as: PEPCID Take 20 mg by mouth daily.   fluticasone 27.5 MCG/SPRAY nasal spray Commonly known as: VERAMYST Place 1 spray into the nose 2 (two) times daily.   fluticasone 50 MCG/ACT nasal spray Commonly known as: FLONASE Place 1 spray into both nostrils in the morning and at bedtime.   furosemide 20 MG tablet Commonly known as: LASIX Take 20 mg by mouth.   levocetirizine 5 MG tablet Commonly known as: XYZAL Take 5 mg by mouth every evening.   mirtazapine 7.5 MG tablet Commonly known as: REMERON Take 7.5 mg by mouth at bedtime.   montelukast 10 MG tablet Commonly known as: SINGULAIR Take 10 mg by mouth at bedtime.   omeprazole 20 MG capsule Commonly known as: PRILOSEC Take 1 capsule (20 mg total) by mouth daily for 14 days.   predniSONE 20 MG tablet Commonly known as: DELTASONE Take 3 PO QAM x3days, 2 PO QAM x3days, 1 PO QAM x3days Start taking on: December 20, 2020   sodium chloride 0.65 % nasal spray Commonly known as: OCEAN Place into the nose.   Spiriva HandiHaler 18 MCG inhalation capsule Generic drug: tiotropium Place 1 capsule into inhaler and inhale daily.   vitamin B-12 1000 MCG tablet Commonly  known as: CYANOCOBALAMIN Take 1,000 mcg by mouth daily.       Follow-up Information    primary care provider. Schedule an appointment as soon as possible for a visit in 1 week(s).              Allergies  Allergen Reactions  . Bactrim [Sulfamethoxazole-Trimethoprim] Itching  . Crestor [Rosuvastatin] Other (See Comments)  . Neurontin [Gabapentin] Nausea And Vomiting  . Topamax [Topiramate] Other (See Comments)  . Penicillins Rash   Allergies as of 12/19/2020      Reactions   Bactrim [sulfamethoxazole-trimethoprim] Itching   Crestor [rosuvastatin] Other (See Comments)   Neurontin [gabapentin] Nausea And Vomiting   Topamax [topiramate] Other (See Comments)   Penicillins Rash      Medication List    STOP taking these medications   amLODipine 10 MG tablet Commonly known as: NORVASC   calcium-vitamin D 250-100 MG-UNIT tablet   Fluticasone-Salmeterol 500-50 MCG/DOSE Aepb Commonly known as: ADVAIR   ipratropium 0.02 % nebulizer solution Commonly known as: ATROVENT   levalbuterol 45 MCG/ACT inhaler Commonly known as: XOPENEX HFA   omega-3 acid ethyl esters 1 g  capsule Commonly known as: LOVAZA   rosuvastatin 5 MG tablet Commonly known as: CRESTOR   topiramate 100 MG tablet Commonly known as: TOPAMAX   traZODone 150 MG tablet Commonly known as: DESYREL     TAKE these medications   albuterol (2.5 MG/3ML) 0.083% nebulizer solution Commonly known as: PROVENTIL Take 2.5 mg by nebulization 3 (three) times daily as needed. Shortness of breath   albuterol 108 (90 Base) MCG/ACT inhaler Commonly known as: VENTOLIN HFA Inhale 2 puffs into the lungs every 6 (six) hours as needed. Shortness of breath   amLODipine-olmesartan 5-40 MG tablet Commonly known as: AZOR Take 1 tablet by mouth daily.   apixaban 5 MG Tabs tablet Commonly known as: ELIQUIS Take 5 mg by mouth 2 (two) times daily.   atorvastatin 40 MG tablet Commonly known as: LIPITOR Take 40 mg by mouth  daily.   betamethasone dipropionate 0.05 % cream APPLY TO THE AFFECTED AREA(S) topically TWICE DAILY FOR 14 DAYS   Breztri Aerosphere 160-9-4.8 MCG/ACT Aero Generic drug: Budeson-Glycopyrrol-Formoterol Inhale 2 puffs into the lungs 2 (two) times daily.   budesonide-formoterol 160-4.5 MCG/ACT inhaler Commonly known as: SYMBICORT Inhale 2 puffs into the lungs 2 (two) times daily.   Calcium Citrate 250 MG Tabs 1 tab(s)   calcium-vitamin D 250-125 MG-UNIT tablet Commonly known as: OSCAL WITH D Take 1 tablet by mouth 2 (two) times daily.   Cholecalciferol 25 MCG (1000 UT) tablet Take by mouth.   diazepam 2 MG tablet Commonly known as: VALIUM Take 2 mg by mouth at bedtime as needed for sedation.   Dupixent 300 MG/2ML prefilled syringe Generic drug: dupilumab Inject 300 mg into the skin every 14 (fourteen) days.   EPINEPHrine 0.3 mg/0.3 mL Soaj injection Commonly known as: EPI-PEN Inject 0.3 mg into the muscle as needed.   famotidine 20 MG tablet Commonly known as: PEPCID Take 20 mg by mouth daily.   fluticasone 27.5 MCG/SPRAY nasal spray Commonly known as: VERAMYST Place 1 spray into the nose 2 (two) times daily.   fluticasone 50 MCG/ACT nasal spray Commonly known as: FLONASE Place 1 spray into both nostrils in the morning and at bedtime.   furosemide 20 MG tablet Commonly known as: LASIX Take 20 mg by mouth.   levocetirizine 5 MG tablet Commonly known as: XYZAL Take 5 mg by mouth every evening.   mirtazapine 7.5 MG tablet Commonly known as: REMERON Take 7.5 mg by mouth at bedtime.   montelukast 10 MG tablet Commonly known as: SINGULAIR Take 10 mg by mouth at bedtime.   omeprazole 20 MG capsule Commonly known as: PRILOSEC Take 1 capsule (20 mg total) by mouth daily for 14 days.   predniSONE 20 MG tablet Commonly known as: DELTASONE Take 3 PO QAM x3days, 2 PO QAM x3days, 1 PO QAM x3days Start taking on: December 20, 2020   sodium chloride 0.65 % nasal  spray Commonly known as: OCEAN Place into the nose.   Spiriva HandiHaler 18 MCG inhalation capsule Generic drug: tiotropium Place 1 capsule into inhaler and inhale daily.   vitamin B-12 1000 MCG tablet Commonly known as: CYANOCOBALAMIN Take 1,000 mcg by mouth daily.       Procedures/Studies: CT Angio Chest PE W and/or Wo Contrast  Result Date: 12/17/2020 CLINICAL DATA:  Chest pain and shortness of breath.  PE suspected. EXAM: CT ANGIOGRAPHY CHEST WITH CONTRAST TECHNIQUE: Multidetector CT imaging of the chest was performed using the standard protocol during bolus administration of intravenous contrast. Multiplanar CT image reconstructions  and MIPs were obtained to evaluate the vascular anatomy. CONTRAST:  59mL OMNIPAQUE IOHEXOL 350 MG/ML SOLN COMPARISON:  08/17/2015. FINDINGS: Cardiovascular: Heart is enlarged. No substantial pericardial effusion. Coronary artery calcification is evident. No large central pulmonary embolus in the pulmonary outflow tract or either main pulmonary artery. No definite pulmonary embolus at the lobar pulmonary artery level. Segmental and subsegmental pulmonary arteries are not reliably evaluated due to a combination of bolus timing and extensive respiratory motion during image acquisition. Mediastinum/Nodes: Scattered small mediastinal lymph nodes are similar to prior, compatible with reactive etiology. There is no hilar lymphadenopathy. The esophagus has normal imaging features. There is no axillary lymphadenopathy. Lungs/Pleura: Stable scarring in the peripheral right lung base (69/6). Left lower lobe basilar atelectasis is similar to mildly progressed in the interval. 3 mm left lower lobe nodule on 54/6 is new in the interval. No overtly suspicious pulmonary nodule or mass. No focal airspace consolidation. No pleural effusion. Upper Abdomen: Unremarkable. Musculoskeletal: No worrisome lytic or sclerotic osseous abnormality. 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 Review of the MIP images confirms the above findings. IMPRESSION: 1. No large central pulmonary embolus in the main or lobar pulmonary arteries. Segmental and subsegmental pulmonary arteries are not reliably evaluated due to a combination of bolus timing and extensive respiratory motion during image acquisition. 2. 3 mm left lower lobe pulmonary nodule, new in the interval. No follow-up needed if patient is low-risk. Non-contrast chest CT can be considered in 12 months if patient is high-risk. This recommendation follows the consensus statement: Guidelines for Management of Incidental Pulmonary Nodules Detected on CT Images: From the Fleischner Society 2017; Radiology 2017; 284:228-243. 3. Bibasilar chronic atelectasis or scarring. 4. Aortic Atherosclerosis (ICD10-I70.0). Electronically Signed   By: Kennith Center M.D.   On: 12/17/2020 14:48   DG Chest Portable 1 View  Result Date: 12/17/2020 CLINICAL DATA:  Cough, dyspnea, left chest pain EXAM: PORTABLE CHEST 1 VIEW COMPARISON:  08/17/2015 chest radiograph. FINDINGS: Surgical hardware from ACD[facility] overlies the lower cervical spine. Stable cardiomediastinal silhouette with normal heart size. No pneumothorax. No pleural effusion. No pulmonary edema. Low lung volumes. Chronic pleural-parenchymal scarring at the right costophrenic angle. No acute consolidative airspace disease. IMPRESSION: Low lung volumes. Chronic pleural-parenchymal scarring at the right costophrenic angle. No active cardiopulmonary disease. Electronically Signed   By: Delbert Phenix M.D.   On: 12/17/2020 12:21      Subjective: Pt reports she is breathing much better and ambulating in room. She feels well to go home.   Discharge Exam: Vitals:   12/19/20 0721 12/19/20 0726  BP:    Pulse:    Resp:    Temp:    SpO2: 95% 95%   Vitals:   12/18/20 2313 12/19/20 0338 12/19/20 0721 12/19/20 0726  BP:  (!) 114/56    Pulse: 86 92    Resp: 19 18    Temp:      TempSrc:      SpO2:  95% 99% 95% 95%  Weight:      Height:       General: Pt is alert, awake, not in acute distress Cardiovascular: normal S1/S2 +, no rubs, no gallops Respiratory: CTA bilaterally, no wheezing, no rhonchi Abdominal: Soft, NT, ND, bowel sounds + Extremities: no edema, no cyanosis   The results of significant diagnostics from this hospitalization (including imaging, microbiology, ancillary and laboratory) are listed below for reference.     Microbiology: Recent Results (from the past 240 hour(s))  SARS Coronavirus 2  by RT PCR (hospital order, performed in [facility] hospital lab) Nasopharyngeal Nasopharyngeal Swab     Status: None   Collection Time: 12/17/20  3:30 PM   Specimen: Nasopharyngeal Swab  Result Value Ref Range Status   SARS Coronavirus 2 NEGATIVE NEGATIVE Final    Comment: (NOTE) SARS-CoV-2 target nucleic acids are NOT DETECTED.  The SARS-CoV-2 RNA is generally detectable in upper and lower respiratory specimens during the acute phase of infection. The lowest concentration of SARS-CoV-2 viral copies this assay can detect is 250 copies / mL. A negative result does not preclude SARS-CoV-2 infection and should not be used as the sole basis for treatment or other patient management decisions.  A negative result may occur with improper specimen collection / handling, submission of specimen other than nasopharyngeal swab, presence of viral mutation(s) within the areas targeted by this assay, and inadequate number of viral copies (<250 copies / mL). A negative result must be combined with clinical observations, patient history, and epidemiological information.  Fact Sheet for Patients:   BoilerBrush.com.cy  Fact Sheet for Healthcare Providers: https://pope.com/  This test is not yet approved or  cleared by the [place] FDA and has been authorized for detection and/or diagnosis of SARS-CoV-2 by FDA under an Emergency Use  Authorization (EUA).  This EUA will remain in effect (meaning this test can be used) for the duration of the COVID-19 declaration under Section 564(b)(1) of the Act, 21 U.S.C. section 360bbb-3(b)(1), unless the authorization is terminated or revoked sooner.  Performed at [facility], [address] [place], [place] [address]      Labs: BNP (last 3 results) Recent Labs    12/17/20 1124  BNP 34.0   Basic Metabolic Panel: Recent Labs  Lab 12/17/20 1124  NA 139  K 3.8  CL 107  CO2 24  GLUCOSE 134*  BUN 9  CREATININE 0.67  CALCIUM 9.3   Liver Function Tests: No results for input(s): AST, ALT, ALKPHOS, BILITOT, PROT, ALBUMIN in the last 168 hours. No results for input(s): LIPASE, AMYLASE in the last 168 hours. No results for input(s): AMMONIA in the last 168 hours. CBC: Recent Labs  Lab 12/17/20 1124  WBC 6.0  NEUTROABS 2.8  HGB 13.7  HCT 42.6  MCV 96.8  PLT 233   Cardiac Enzymes: No results for input(s): CKTOTAL, CKMB, CKMBINDEX, TROPONINI in the last 168 hours. BNP: Invalid input(s): POCBNP CBG: Recent Labs  Lab 12/18/20 1627 12/18/20 2024 12/19/20 0336 12/19/20 0730 12/19/20 1114  GLUCAP 169* 191* 179* 139* 123*   D-Dimer No results for input(s): DDIMER in the last 72 hours. Hgb A1c Recent Labs    12/17/20 1124  HGBA1C 6.1*   Lipid Profile No results for input(s): CHOL, HDL, LDLCALC, TRIG, CHOLHDL, LDLDIRECT in the last 72 hours. Thyroid function studies No results for input(s): TSH, T4TOTAL, T3FREE, THYROIDAB in the last 72 hours.  Invalid input(s): FREET3 Anemia work up No results for input(s): VITAMINB12, FOLATE, FERRITIN, TIBC, IRON, RETICCTPCT in the last 72 hours. Urinalysis    Component Value Date/Time   COLORURINE YELLOW 02/16/2008 1854   APPEARANCEUR CLEAR 02/16/2008 1854   LABSPEC 1.015 02/16/2008 1854   PHURINE 7.0 02/16/2008 1854   GLUCOSEU NEGATIVE 02/16/2008 1854   HGBUR NEGATIVE 02/16/2008 1854   BILIRUBINUR NEGATIVE  02/16/2008 1854   KETONESUR NEGATIVE 02/16/2008 1854   PROTEINUR NEGATIVE 02/16/2008 1854   UROBILINOGEN 0.2 02/16/2008 1854   NITRITE NEGATIVE 02/16/2008 1854   LEUKOCYTESUR TRACE (A) 02/16/2008 1854  Sepsis Labs Invalid input(s): PROCALCITONIN,  WBC,  LACTICIDVEN Microbiology Recent Results (from the past 240 hour(s))  SARS Coronavirus 2 by RT PCR (hospital order, performed in [facility] hospital lab) Nasopharyngeal Nasopharyngeal Swab     Status: None   Collection Time: 12/17/20  3:30 PM   Specimen: Nasopharyngeal Swab  Result Value Ref Range Status   SARS Coronavirus 2 NEGATIVE NEGATIVE Final    Comment: (NOTE) SARS-CoV-2 target nucleic acids are NOT DETECTED.  The SARS-CoV-2 RNA is generally detectable in upper and lower respiratory specimens during the acute phase of infection. The lowest concentration of SARS-CoV-2 viral copies this assay can detect is 250 copies / mL. A negative result does not preclude SARS-CoV-2 infection and should not be used as the sole basis for treatment or other patient management decisions.  A negative result may occur with improper specimen collection / handling, submission of specimen other than nasopharyngeal swab, presence of viral mutation(s) within the areas targeted by this assay, and inadequate number of viral copies (<250 copies / mL). A negative result must be combined with clinical observations, patient history, and epidemiological information.  Fact Sheet for Patients:   BoilerBrush.com.cy  Fact Sheet for Healthcare Providers: https://pope.com/  This test is not yet approved or  cleared by the [place] FDA and has been authorized for detection and/or diagnosis of SARS-CoV-2 by FDA under an Emergency Use Authorization (EUA).  This EUA will remain in effect (meaning this test can be used) for the duration of the COVID-19 declaration under Section 564(b)(1) of the Act, 21  U.S.C. section 360bbb-3(b)(1), unless the authorization is terminated or revoked sooner.  Performed at [facility], [address] [place], [place] [address]    Time coordinating discharge: 32 mins  SIGNED:  Standley Dakins, MD  [facility] 12/19/2020, 4:38 PM How to contact the [facility] Attending or Consulting provider 7A - 7P or covering provider during after hours 7P -7A, for this patient?  1. Check the care team in [facility] and look for a) attending/consulting [facility] provider listed and b) the [facility] team listed 2. Log into [email or web address] and use [place]'s universal password to access. If you do not have the password, please contact the hospital operator. 3. Locate the [facility] provider you are looking for under [facility] and page to a number that you can be directly reached. 4. If you still have difficulty reaching the provider, please page the [facility] (Director on Call) for the Hospitalists listed on amion for assistance.

## 2022-08-02 IMAGING — CT CT ANGIO CHEST
2 of 6 series · 18 of 46 positions shown · IV contrast (Omnipaque or Isovue)
Comparison: 08/17/2015.

CLINICAL DATA: Chest pain and shortness of breath.  PE suspected.

EXAM:
CT ANGIOGRAPHY CHEST WITH CONTRAST
TECHNIQUE: Multidetector CT imaging of the chest was performed using the
standard protocol during bolus administration of intravenous
contrast. Multiplanar CT image reconstructions and MIPs were
obtained to evaluate the vascular anatomy.
CONTRAST:  75mL OMNIPAQUE IOHEXOL 350 MG/ML SOLN

[Series 5: pe axial thins · axial · 0.65mm/px · z∈[-199,+22]mm · 15 of 243 slices shown]
[im 11/243  lung]
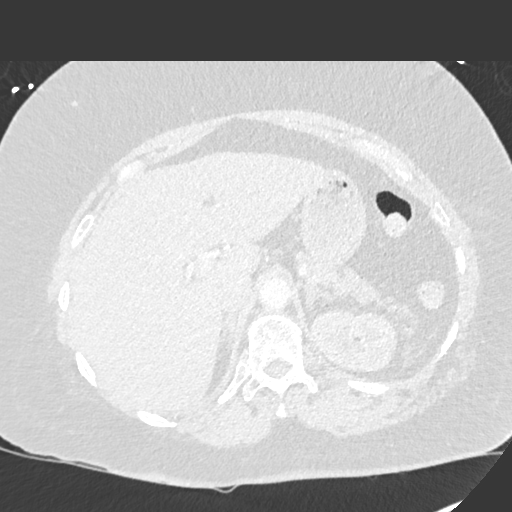
[im 32/243  soft-tissue]
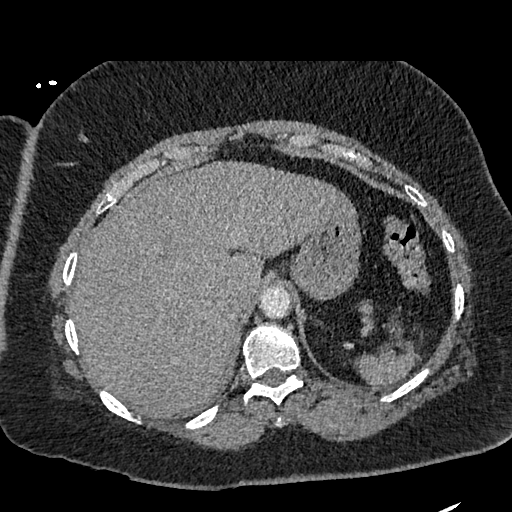
[im 43/243  lung]
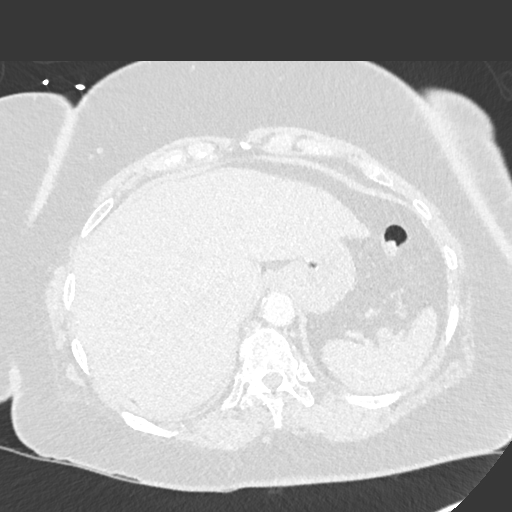
[im 64/243  soft-tissue]
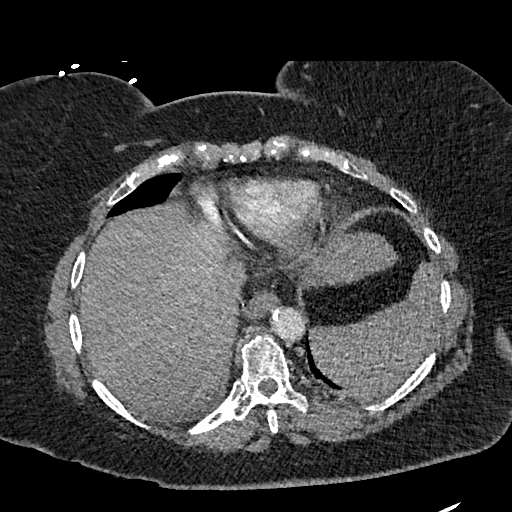
[im 74/243  lung]
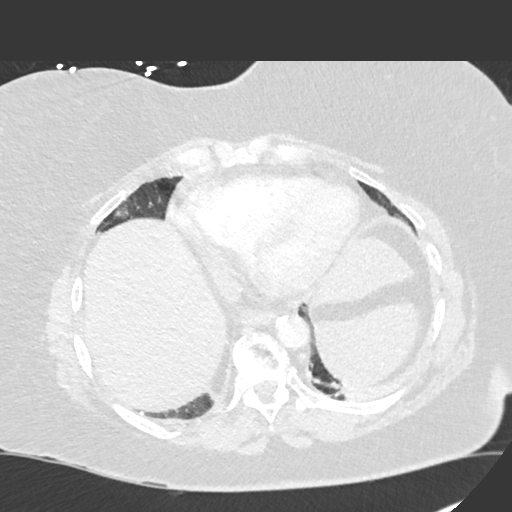
[im 95/243  soft-tissue]
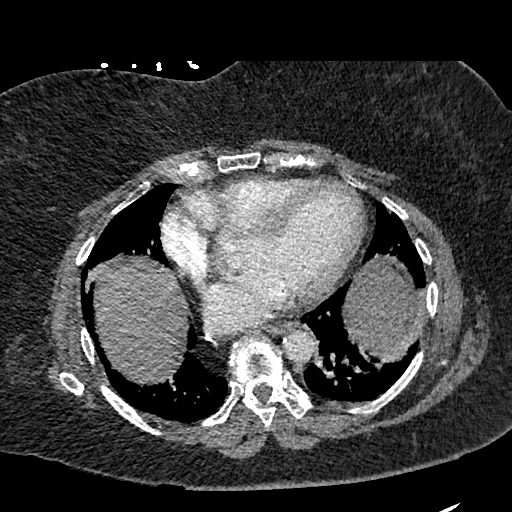
[im 106/243  lung]
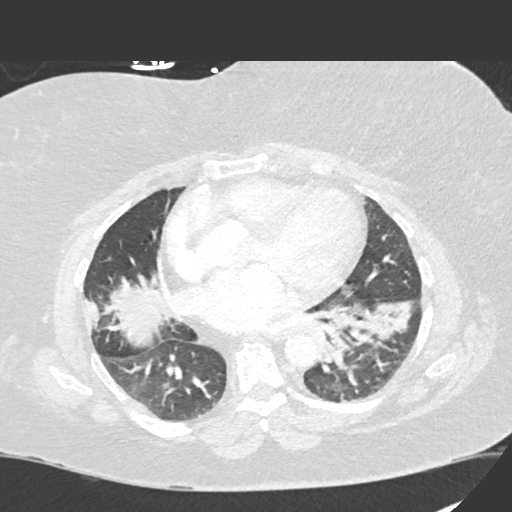
[im 127/243  soft-tissue]
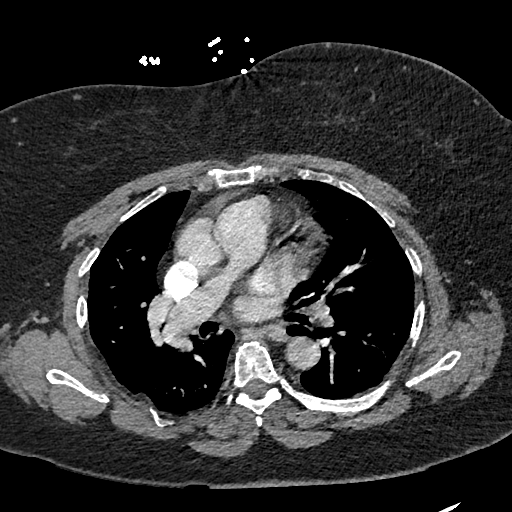
[im 137/243  lung]
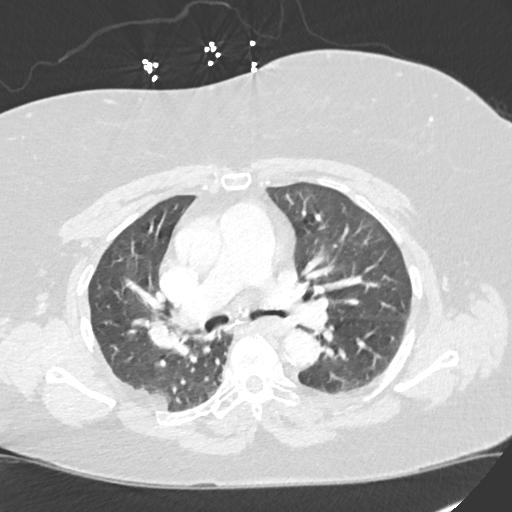
[im 148/243  soft-tissue]
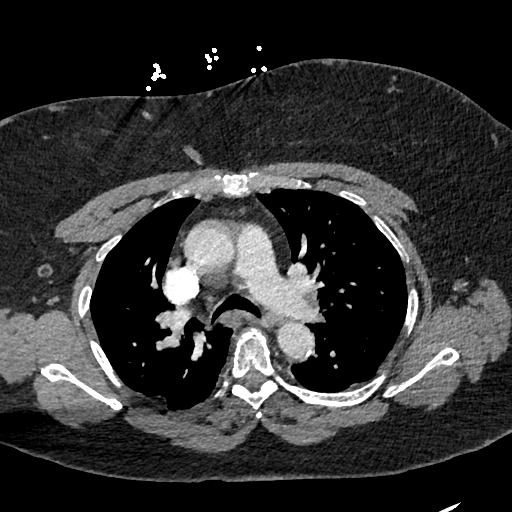
[im 169/243  lung]
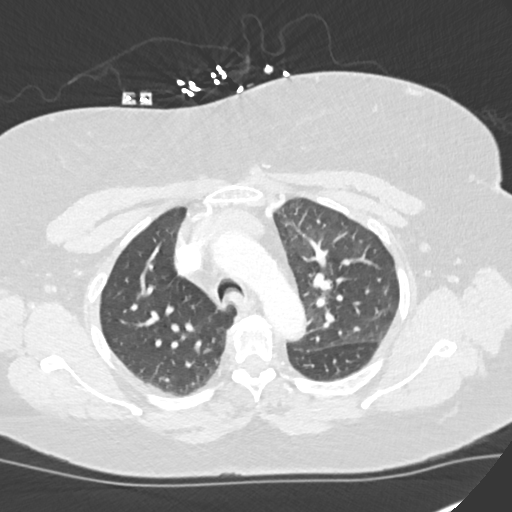
[im 179/243  soft-tissue]
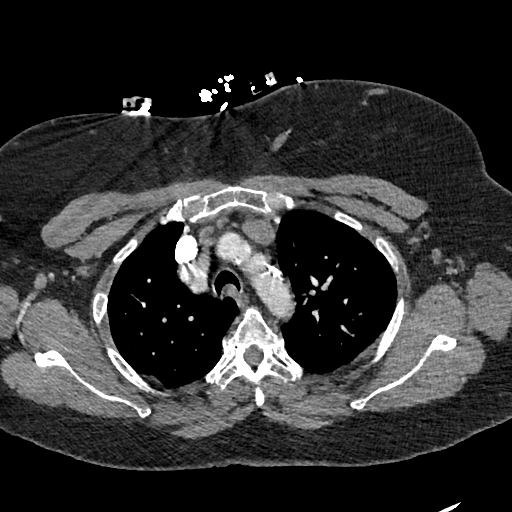
[im 200/243  lung]
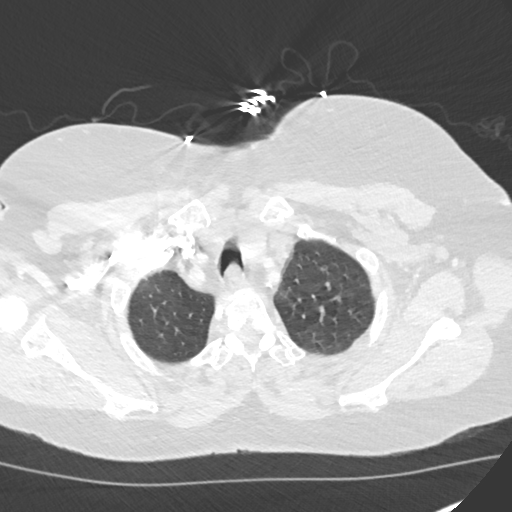
[im 211/243  soft-tissue]
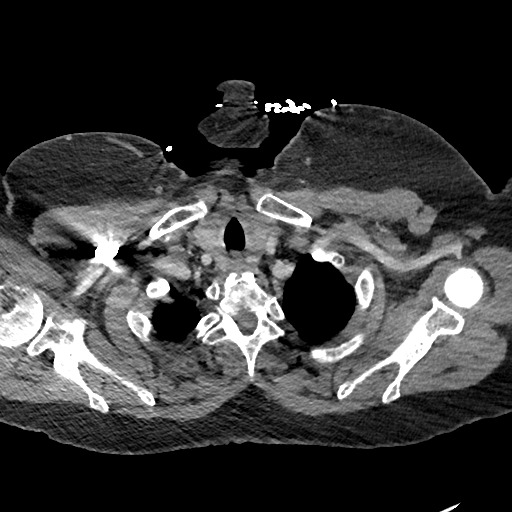
[im 232/243  lung]
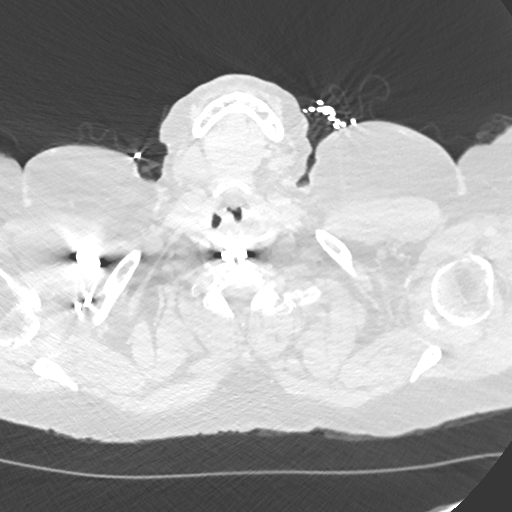

[Series 8: cor soft · coronal · 0.50mm/px · 3 of 137 slices shown]
[im 35/137  soft-tissue]
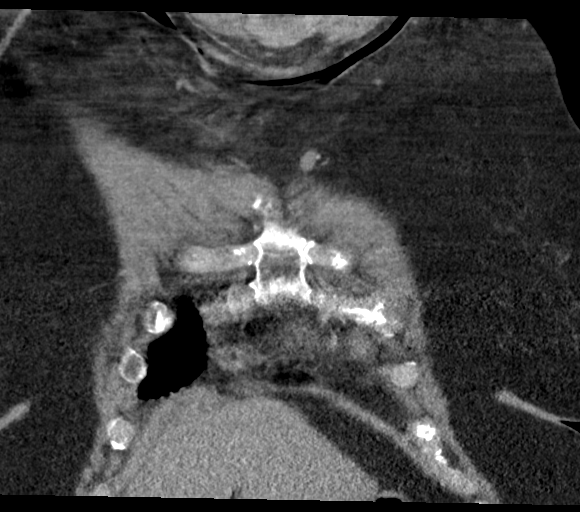
[im 69/137  soft-tissue]
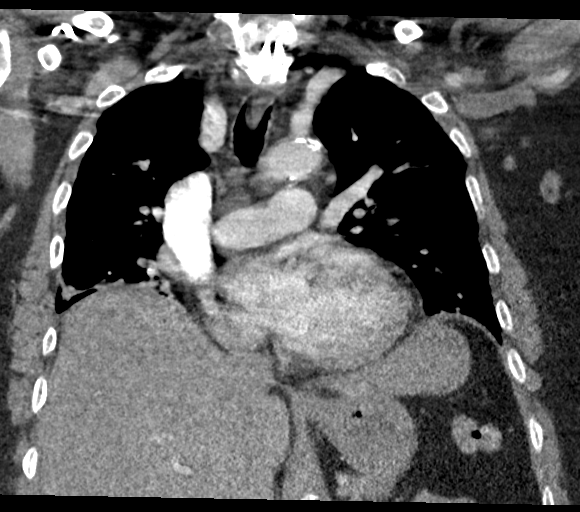
[im 103/137  soft-tissue]
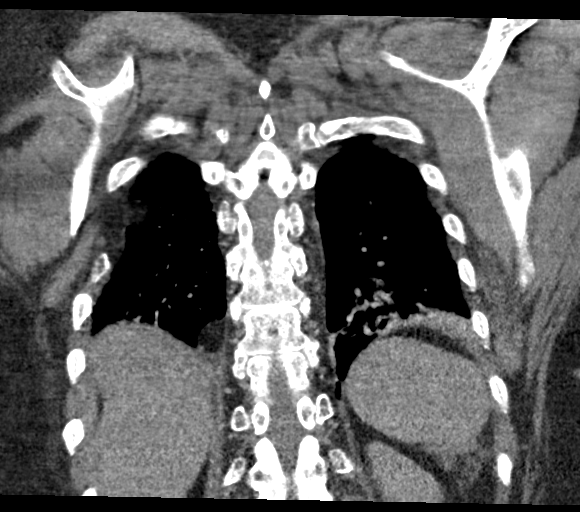

[18 of 46 positions shown; findings below may reference images not displayed]

FINDINGS: Cardiovascular: Heart is enlarged. No substantial pericardial
effusion. Coronary artery calcification is evident. No large central
pulmonary embolus in the pulmonary outflow tract or either main
pulmonary artery. No definite pulmonary embolus at the lobar
pulmonary artery level. Segmental and subsegmental pulmonary
arteries are not reliably evaluated due to a combination of bolus
timing and extensive respiratory motion during image acquisition.

Mediastinum/Nodes: Scattered small mediastinal lymph nodes are
similar to prior, compatible with reactive etiology. There is no
hilar lymphadenopathy. The esophagus has normal imaging features.
There is no axillary lymphadenopathy.

Lungs/Pleura: Stable scarring in the peripheral right lung base
(69/6). Left lower lobe basilar atelectasis is similar to mildly
progressed in the interval. 3 mm left lower lobe nodule on 54/6 is
new in the interval. No overtly suspicious pulmonary nodule or mass.
No focal airspace consolidation. No pleural effusion.

Upper Abdomen: Unremarkable.

Musculoskeletal: No worrisome lytic or sclerotic osseous
abnormality. Chronic fracture nonunion posterior right sixth rib.

Review of the MIP images confirms the above findings.
IMPRESSION: 1. No large central pulmonary embolus in the main or lobar pulmonary
arteries. Segmental and subsegmental pulmonary arteries are not
reliably evaluated due to a combination of bolus timing and
extensive respiratory motion during image acquisition.
2. 3 mm left lower lobe pulmonary nodule, new in the interval. No
follow-up needed if patient is low-risk. Non-contrast chest CT can
be considered in 12 months if patient is high-risk. This
recommendation follows the consensus statement: Guidelines for
Management of Incidental Pulmonary Nodules Detected on CT Images:
3. Bibasilar chronic atelectasis or scarring.
4. Aortic Atherosclerosis ([id number].V).
